# Patient Record
Sex: Male | Born: 1975 | Race: White | Hispanic: No | Marital: Married | Smoking: Never smoker
Health system: Southern US, Community
[De-identification: ages and names within clinical notes are randomized; demographics above are authoritative.]

## PROBLEM LIST (undated history)

## (undated) HISTORY — PX: UPPER GASTROINTESTINAL ENDOSCOPY: SHX188

## (undated) HISTORY — PX: NO PAST SURGERIES: SHX2092

---

## 2001-07-29 ENCOUNTER — Emergency Department (HOSPITAL_COMMUNITY): Admission: EM | Admit: 2001-07-29 | Discharge: 2001-07-29 | Payer: Self-pay | Admitting: Emergency Medicine

## 2002-08-15 ENCOUNTER — Emergency Department (HOSPITAL_COMMUNITY): Admission: EM | Admit: 2002-08-15 | Discharge: 2002-08-15 | Payer: Self-pay

## 2006-09-27 ENCOUNTER — Emergency Department (HOSPITAL_COMMUNITY): Admission: EM | Admit: 2006-09-27 | Discharge: 2006-09-27 | Payer: Self-pay | Admitting: Emergency Medicine

## 2007-07-20 ENCOUNTER — Ambulatory Visit: Payer: Self-pay | Admitting: Family Medicine

## 2007-07-20 DIAGNOSIS — R110 Nausea: Secondary | ICD-10-CM

## 2007-07-20 DIAGNOSIS — J029 Acute pharyngitis, unspecified: Secondary | ICD-10-CM

## 2007-07-20 LAB — CONVERTED CEMR LAB
Glucose, Urine, Semiquant: NEGATIVE
Protein, U semiquant: NEGATIVE
Rapid Strep: NEGATIVE
WBC Urine, dipstick: NEGATIVE
pH: 7

## 2007-09-04 ENCOUNTER — Encounter: Payer: Self-pay | Admitting: Family Medicine

## 2007-11-08 ENCOUNTER — Ambulatory Visit: Payer: Self-pay | Admitting: Family Medicine

## 2007-11-08 DIAGNOSIS — J069 Acute upper respiratory infection, unspecified: Secondary | ICD-10-CM | POA: Insufficient documentation

## 2007-11-12 ENCOUNTER — Telehealth: Payer: Self-pay | Admitting: Family Medicine

## 2009-10-23 ENCOUNTER — Ambulatory Visit: Payer: Self-pay | Admitting: Family Medicine

## 2009-10-23 DIAGNOSIS — J329 Chronic sinusitis, unspecified: Secondary | ICD-10-CM | POA: Insufficient documentation

## 2009-12-07 ENCOUNTER — Ambulatory Visit: Payer: Self-pay | Admitting: Family Medicine

## 2009-12-07 DIAGNOSIS — R109 Unspecified abdominal pain: Secondary | ICD-10-CM | POA: Insufficient documentation

## 2009-12-08 ENCOUNTER — Encounter: Admission: RE | Admit: 2009-12-08 | Discharge: 2009-12-08 | Payer: Self-pay | Admitting: Family Medicine

## 2010-01-07 ENCOUNTER — Ambulatory Visit: Payer: Self-pay | Admitting: Family Medicine

## 2010-02-16 NOTE — Assessment & Plan Note (Signed)
Summary: Sinusitis   Vital Signs:  Patient profile:   35 year old male Height:      72.5 inches Weight:      178 pounds Pulse rate:   73 / minute BP sitting:   119 / 68  (right arm) Cuff size:   regular  Vitals Entered By: Avon Gully CMA, Duncan Dull) (October 23, 2009 4:18 PM) CC: itchy ears and sore throat since sat, worse at night   Primary Care Provider:  Nani Gasser MD  CC:  itchy ears and sore throat since sat and worse at night.  History of Present Illness: itchy ears and sore throat since sat, worse at night. Sxs for 7 days.  Ears are better. Now have ST.  Worse withcough or sneeze.  Sinus pressure around the right eye. No fever or HA. No GI sxs. Started mucinex D and nasal saline wash andstill not better.   Current Medications (verified): 1)  None  Allergies (verified): 1)  ! Pcn  Comments:  Nurse/Medical Assistant: The patient's medications and allergies were reviewed with the patient and were updated in the Medication and Allergy Lists. Avon Gully CMA, Duncan Dull) (October 23, 2009 4:19 PM)  Physical Exam  General:  Well-developed,well-nourished,in no acute distress; alert,appropriate and cooperative throughout examination Head:  Normocephalic and atraumatic without obvious abnormalities. No apparent alopecia or balding. Eyes:  No corneal or conjunctival inflammation noted. EOMI. Perrla. Ears:  External ear exam shows no significant lesions or deformities.  Otoscopic examination reveals clear canals, tympanic membranes are intact bilaterally without bulging, retraction, inflammation or discharge. Hearing is grossly normal bilaterally. Nose:  External nasal examination shows no deformity or inflammation.  Mouth:  Oral mucosa and oropharynx without lesions or exudates.  Teeth in good repair. Neck:  No deformities, masses, or tenderness noted. Lungs:  Normal respiratory effort, chest expands symmetrically. Lungs are clear to auscultation, no crackles or  wheezes. Heart:  Normal rate and regular rhythm. S1 and S2 normal without gallop, murmur, click, rub or other extra sounds. Skin:  no rashes.   Cervical Nodes:  No lymphadenopathy noted Psych:  Cognition and judgment appear intact. Alert and cooperative with normal attention span and concentration. No apparent delusions, illusions, hallucinations   Impression & Recommendations:  Problem # 1:  SINUSITIS (ICD-473.9) Sx for one week with one sided sinus pressure, likely bacterail sinusitis. Also recommend add an antihistamine since sxs started with ear itching which is more consistant with allergies.  The following medications were removed from the medication list:    Zithromax Z-pak 250 Mg Tabs (Azithromycin) .Marland Kitchen... Take as directed His updated medication list for this problem includes:    Zithromax Z-pak 250 Mg Tabs (Azithromycin) .Marland Kitchen... Take as directed.  Take antibiotics for full duration. Discussed treatment options including indications for coronal CT scan of sinuses and ENT referral.   Complete Medication List: 1)  Zithromax Z-pak 250 Mg Tabs (Azithromycin) .... Take as directed.  Patient Instructions: 1)  Call if not better in one week.  2)  Get your flu shot at work.   Contraindications/Deferment of Procedures/Staging:    Test/Procedure: FLU VAX    Reason for deferment: patient declined  Prescriptions: ZITHROMAX Z-PAK 250 MG TABS (AZITHROMYCIN) Take as directed.  #1 pack x 0   Entered and Authorized by:   Nani Gasser MD   Signed by:   Nani Gasser MD on 10/23/2009   Method used:   [facility] to        [facility]  [address] (retail)       [address] [place], [place]  [address]       Ph: [phone] or [phone]       Fax: [phone]   RxID:   [id number]

## 2010-02-16 NOTE — Assessment & Plan Note (Signed)
Summary: Groin Pain   Vital Signs:  Patient profile:   35 year old male Height:      72.5 inches Weight:      176 pounds Pulse rate:   75 / minute BP sitting:   137 / 92  (right arm) Cuff size:   regular  Vitals Entered By: Avon Gully CMA, Duncan Dull) (December 07, 2009 4:28 PM) CC: severe pain in the groin x 12 days   Primary Care Provider:  Nani Gasser MD  CC:  severe pain in the groin x 12 days.  History of Present Illness: severe pain in the groin x 12 days. NOthing popping, pulled or cuased any traum. About 2 days later felt worse. got better for a couple of days,and they got worse again. Today feels like kicked in the groin. In thr groin crease on the right. No bulging. No hx of hernia.  No fever.  he rates his pain 4 out of 10  Current Medications (verified): 1)  None  Allergies (verified): 1)  ! Pcn  Comments:  Nurse/Medical Assistant: The patient's medications and allergies were reviewed with the patient and were updated in the Medication and Allergy Lists. Avon Gully CMA, Duncan Dull) (December 07, 2009 4:28 PM)  Physical Exam  General:  Well-developed,well-nourished,in no acute distress; alert,appropriate and cooperative throughout examination Genitalia:  No palpable hernia. Tender above the testile along the spermatic cord. No swelling or redness.     Impression & Recommendations:  Problem # 1:  GROIN PAIN (ICD-789.09) I am concerned about testicular torsion. I don't feel any herniations. Will refer for [place]. If pain suddenly gets worse, fever or nausea then needs to go to the ED.  we were able to an ultrasound scheduled first thing tomorrow morning.  If this is normal I will refer him to urology for further evaluation.  he can use Tylenol or ibuprofen p.r.n. for acute pain.  Right now he is not taking anything. Orders: T-*Unlisted Diagnostic X-ray test/procedure [id number])   Orders Added: 1)  T-*Unlisted Diagnostic X-ray test/procedure [09811] 2)   Est. Patient Level IV [91478]

## 2010-04-29 ENCOUNTER — Ambulatory Visit: Payer: Self-pay | Admitting: Family Medicine

## 2010-09-08 ENCOUNTER — Emergency Department (HOSPITAL_COMMUNITY): Payer: Worker's Compensation

## 2010-09-08 ENCOUNTER — Telehealth: Payer: Self-pay | Admitting: Family Medicine

## 2010-09-08 ENCOUNTER — Emergency Department (HOSPITAL_COMMUNITY)
Admission: EM | Admit: 2010-09-08 | Discharge: 2010-09-08 | Disposition: A | Discharge status: Home / Self Care | Payer: Worker's Compensation | Attending: Emergency Medicine | Admitting: Emergency Medicine

## 2010-09-08 DIAGNOSIS — Y990 Civilian activity done for income or pay: Secondary | ICD-10-CM | POA: Insufficient documentation

## 2010-09-08 DIAGNOSIS — X500XXA Overexertion from strenuous movement or load, initial encounter: Secondary | ICD-10-CM | POA: Insufficient documentation

## 2010-09-08 DIAGNOSIS — Y9269 Other specified industrial and construction area as the place of occurrence of the external cause: Secondary | ICD-10-CM | POA: Insufficient documentation

## 2010-09-08 DIAGNOSIS — S93409A Sprain of unspecified ligament of unspecified ankle, initial encounter: Secondary | ICD-10-CM | POA: Insufficient documentation

## 2010-09-08 NOTE — Telephone Encounter (Signed)
Error. The xray was done in theED. Not sure why send to my desktop.

## 2011-04-01 ENCOUNTER — Encounter: Payer: Self-pay | Admitting: Physician Assistant

## 2011-04-01 ENCOUNTER — Ambulatory Visit (INDEPENDENT_AMBULATORY_CARE_PROVIDER_SITE_OTHER): Payer: 59 | Admitting: Physician Assistant

## 2011-04-01 ENCOUNTER — Other Ambulatory Visit: Payer: Self-pay | Admitting: *Deleted

## 2011-04-01 VITALS — BP 131/85 | HR 96 | Temp 98.4°F | Ht 72.0 in | Wt 174.0 lb

## 2011-04-01 DIAGNOSIS — Z131 Encounter for screening for diabetes mellitus: Secondary | ICD-10-CM

## 2011-04-01 DIAGNOSIS — J329 Chronic sinusitis, unspecified: Secondary | ICD-10-CM

## 2011-04-01 DIAGNOSIS — Z13220 Encounter for screening for lipoid disorders: Secondary | ICD-10-CM

## 2011-04-01 MED ORDER — AZITHROMYCIN 250 MG PO TABS: ORAL_TABLET | ORAL | Status: AC

## 2011-04-01 NOTE — Progress Notes (Signed)
  Subjective:    Patient ID: Micheal Brewer, male    DOB: 1975-02-22, 36 y.o.   MRN: [id number]  Sinusitis This is a new problem. The problem has been gradually improving since onset. There has been no fever. Pain scale: Pain from sinus pressure is 5/10. sore throat pain today 4/10. The pain is moderate. Associated symptoms include chills, congestion, coughing, headaches, sinus pressure, a sore throat and swollen glands. Pertinent negatives include no ear pain, hoarse voice, neck pain, shortness of breath or sneezing. Past treatments include acetaminophen, saline sprays and sitting up (Mucinex and sinus rinses.). The treatment provided moderate (Today he reports feeling much better everywhere else but he still has a sore throat.) relief.     Review of Systems  Constitutional: Positive for chills.  HENT: Positive for congestion, sore throat and sinus pressure. Negative for ear pain, hoarse voice, sneezing and neck pain.   Respiratory: Positive for cough. Negative for shortness of breath.   Neurological: Positive for headaches.       Objective:   Physical Exam  Constitutional: He is oriented to person, place, and time. He appears well-developed and well-nourished.  HENT:  Head: Normocephalic and atraumatic.  Right Ear: External ear normal.  Left Ear: External ear normal.       Bilateral turbinates red and swollen with nasal discharge present.  TM's normal. Oropharynx erythematous without exudate and post nasal drip present.  Eyes: Conjunctivae are normal.  Neck:       Anterior cervical lymph nodes enlarged and tender to palpation bilaterally.   Cardiovascular: Normal rate, regular rhythm and normal heart sounds.   Pulmonary/Chest: Effort normal and breath sounds normal. He has no wheezes.  Lymphadenopathy:    He has cervical adenopathy.  Neurological: He is alert and oriented to person, place, and time.  Psychiatric: He has a normal mood and affect. His behavior is normal.           Assessment & Plan:  Sinusitis- Zpak was given but I encourage to to wait it out a few days and see if you continue to improve if you do not then start Zpak for 5 days. In the meantime continue to use mucinex, tylenol, and sinus rinses to keep the mucus and drainage from settling. Treat sore throat with salt water gargles and lozenges.   Gave handout for labs to get blood drawn with in the next 2 weeks for cholesterol and CMP. Will call with results.

## 2011-04-01 NOTE — Patient Instructions (Signed)
Zpak was given but I encourage to to wait it out a few days and see if you continue to improve if you do not then start Zpak for 5 days. Gave handout for labs to get blood drawn with in the next 2 weeks for cholesterol. Fast for 8 hours before.  Sinusitis Sinuses are air pockets within the bones of your face. The growth of bacteria within a sinus leads to infection. The infection prevents the sinuses from draining. This infection is called sinusitis. SYMPTOMS  There will be different areas of pain depending on which sinuses have become infected.  The maxillary sinuses often produce pain beneath the eyes.   Frontal sinusitis may cause pain in the middle of the forehead and above the eyes.  Other problems (symptoms) include:  Toothaches.   Colored, pus-like (purulent) drainage from the nose.   Swelling, warmth, and tenderness over the sinus areas may be signs of infection.  TREATMENT  Sinusitis is most often determined by an exam.X-rays may be taken. If x-rays have been taken, make sure you obtain your results or find out how you are to obtain them. Your caregiver may give you medications (antibiotics). These are medications that will help kill the bacteria causing the infection. You may also be given a medication (decongestant) that helps to reduce sinus swelling.  HOME CARE INSTRUCTIONS   Only take over-the-counter or prescription medicines for pain, discomfort, or fever as directed by your caregiver.   Drink extra fluids. Fluids help thin the mucus so your sinuses can drain more easily.   Applying either moist heat or ice packs to the sinus areas may help relieve discomfort.   Use saline nasal sprays to help moisten your sinuses. The sprays can be found at your local drugstore.  SEEK IMMEDIATE MEDICAL CARE IF:  You have a fever.   You have increasing pain, severe headaches, or toothache.   You have nausea, vomiting, or drowsiness.   You develop unusual swelling around the face or  trouble seeing.  MAKE SURE YOU:   Understand these instructions.   Will watch your condition.   Will get help right away if you are not doing well or get worse.  Document Released: 01/03/2005 Document Revised: 12/23/2010 Document Reviewed: 08/02/2006 [facility] Patient Information 2012 [place], [place].

## 2011-04-01 NOTE — Telephone Encounter (Signed)
Pt would like the Zpack [facility] [address]. Pt does not have the script in his possession.

## 2011-04-04 NOTE — Telephone Encounter (Signed)
Medication sent when encounter was for visit.

## 2011-09-26 ENCOUNTER — Encounter: Payer: Self-pay | Admitting: Family Medicine

## 2011-10-03 ENCOUNTER — Encounter: Payer: Self-pay | Admitting: Family Medicine

## 2011-10-03 ENCOUNTER — Ambulatory Visit (INDEPENDENT_AMBULATORY_CARE_PROVIDER_SITE_OTHER): Payer: 59 | Admitting: Family Medicine

## 2011-10-03 VITALS — BP 122/82 | HR 68 | Wt 176.0 lb

## 2011-10-03 DIAGNOSIS — Z23 Encounter for immunization: Secondary | ICD-10-CM

## 2011-10-03 DIAGNOSIS — Z0000 Encounter for general adult medical examination without abnormal findings: Secondary | ICD-10-CM

## 2011-10-03 LAB — COMPLETE METABOLIC PANEL WITH GFR
ALT: 11 U/L (ref 0–53)
CO2: 27 mEq/L (ref 19–32)
Creat: 1.09 mg/dL (ref 0.50–1.35)
GFR, Est African American: >89 mL/min
Total Bilirubin: 0.8 mg/dL (ref 0.3–1.2)

## 2011-10-03 LAB — CBC
HCT: 41.9 % (ref 39.0–52.0)
MCH: 32.7 pg (ref 26.0–34.0)
MCHC: 35.3 g/dL (ref 30.0–36.0)
MCV: 92.5 fL (ref 78.0–100.0)
RDW: 12.8 % (ref 11.5–15.5)

## 2011-10-03 LAB — LIPID PANEL
Cholesterol: 149 mg/dL (ref 0–200)
HDL: 37 mg/dL — ABNORMAL LOW (ref >39)
LDL Cholesterol: 92 mg/dL (ref 0–99)
Triglycerides: 99 mg/dL (ref <150)

## 2011-10-03 NOTE — Progress Notes (Signed)
  Subjective:    Patient ID: Micheal Brewer, male    DOB: [birth year], 36 y.o.   MRN: [id number]  HPI Here for CPE today. No specific complaints.    Review of Systems BP 122/82  Pulse 68  Wt 176 lb (79.833 kg)    Allergies  Allergen Reactions  . Penicillins     REACTION: rash    No past medical history on file.  Past Surgical History  Procedure Date  . No past surgeries     History   Social History  . Marital Status: Married    Spouse Name: Crystal    Number of Children: 1  . Years of Education: N/A   Occupational History  . Police Officer  [employer]   Social History Main Topics  . Smoking status: Never Smoker   . Smokeless tobacco: Not on file  . Alcohol Use: 3.0 - 4.0 oz/week    6-8 drink(s) per week  . Drug Use: No  . Sexually Active: Not on file   Other Topics Concern  . Not on file   Social History Narrative   Works out regularly.     Family History  Problem Relation Age of Onset  . Heart failure Mother   . Hypertension Brother     Outpatient Encounter Prescriptions as of 10/03/2011  Medication Sig Dispense Refill  . loratadine (CLARITIN) 10 MG tablet Take 10 mg by mouth as needed.               Objective:   Physical Exam  Constitutional: He is oriented to person, place, and time. He appears well-developed and well-nourished.  HENT:  Head: Normocephalic and atraumatic.  Right Ear: External ear normal.  Left Ear: External ear normal.  Nose: Nose normal.  Mouth/Throat: Oropharynx is clear and moist.  Eyes: Conjunctivae normal and EOM are normal. Pupils are equal, round, and reactive to light.  Neck: Normal range of motion. Neck supple. No thyromegaly present.  Cardiovascular: Normal rate, regular rhythm, normal heart sounds and intact distal pulses.   Pulmonary/Chest: Effort normal and breath sounds normal.  Abdominal: Soft. Bowel sounds are normal. He exhibits no distension and no mass. There is no tenderness. There is no  rebound and no guarding.  Musculoskeletal: Normal range of motion.  Lymphadenopathy:    He has no cervical adenopathy.  Neurological: He is alert and oriented to person, place, and time. He has normal reflexes.  Skin: Skin is warm and dry.       Small 1.5 cm lipoma on the mid chest over the xiphoid process. It is mobile,   Psychiatric: He has a normal mood and affect. His behavior is normal. Judgment and thought content normal.          Assessment & Plan:  Start a regular exercise program and make sure you are eating a healthy diet Try to eat 4 servings of dairy a day or take a calcium supplement (500mg  twice a day). Your vaccines are up to date.  Due for CMP, lipids, cbc.

## 2011-10-03 NOTE — Patient Instructions (Addendum)
Start a regular exercise program and make sure you are eating a healthy diet Try to eat 4 servings of dairy a day  Your vaccines are up to date.   

## 2011-10-04 ENCOUNTER — Encounter: Payer: Self-pay | Admitting: *Deleted

## 2013-03-08 ENCOUNTER — Encounter: Payer: Self-pay | Admitting: Emergency Medicine

## 2013-03-08 ENCOUNTER — Emergency Department (INDEPENDENT_AMBULATORY_CARE_PROVIDER_SITE_OTHER): Payer: 59

## 2013-03-08 ENCOUNTER — Emergency Department
Admission: EM | Admit: 2013-03-08 | Discharge: 2013-03-08 | Disposition: A | Discharge status: Home / Self Care | Payer: 59 | Source: Home / Self Care | Attending: Family Medicine | Admitting: Family Medicine

## 2013-03-08 DIAGNOSIS — M25539 Pain in unspecified wrist: Secondary | ICD-10-CM

## 2013-03-08 DIAGNOSIS — S63509A Unspecified sprain of unspecified wrist, initial encounter: Secondary | ICD-10-CM

## 2013-03-08 DIAGNOSIS — S62109A Fracture of unspecified carpal bone, unspecified wrist, initial encounter for closed fracture: Secondary | ICD-10-CM

## 2013-03-08 DIAGNOSIS — M25439 Effusion, unspecified wrist: Secondary | ICD-10-CM

## 2013-03-08 NOTE — ED Provider Notes (Addendum)
CSN: [id number]     Arrival date & time 03/08/13  1759 History   First MD Initiated Contact with Patient 03/08/13 1804     Chief Complaint  Patient presents with  . Wrist Pain      HPI Right wrist pain x1 day. Patient accidentally fell landing on his right wrist earlier today. Patient slipped on the ice. Has had ulnar sided wrist pain since this point. Pain borders on the deviation. Mild swelling.  No numbness or paresthesias History reviewed. No pertinent past medical history. Past Surgical History  Procedure Laterality Date  . No past surgeries     Family History  Problem Relation Age of Onset  . Heart failure Mother   . Hypertension Brother    History  Substance Use Topics  . Smoking status: Never Smoker   . Smokeless tobacco: Not on file  . Alcohol Use: 3 - 4 oz/week    6-8 drink(s) per week    Review of Systems  All other systems reviewed and are negative.      Allergies  Penicillins  Home Medications   Current Outpatient Rx  Name  Route  Sig  Dispense  Refill  . loratadine (CLARITIN) 10 MG tablet   Oral   Take 10 mg by mouth as needed.           BP 145/86  Pulse 84  Resp 14  Wt 182 lb (82.555 kg)  SpO2 98% Physical Exam  Constitutional: He appears well-developed and well-nourished.  HENT:  Head: Normocephalic and atraumatic.  Eyes: Conjunctivae are normal. Pupils are equal, round, and reactive to light.  Neck: Normal range of motion.  Cardiovascular: Normal rate and regular rhythm.   Pulmonary/Chest: Effort normal.  Abdominal: Soft.  Musculoskeletal:       Hands: Neurological: He is alert.    ED Course  Procedures (including critical care time) Labs Review Labs Reviewed - No data to display Imaging Review Dg Wrist Complete Right  03/08/2013   CLINICAL DATA:  Wrist pain, fall  EXAM: RIGHT WRIST - COMPLETE 3+ VIEW  COMPARISON:  None.  FINDINGS: Normal alignment. Distal radius and ulna appear intact. Preserved joint spaces. No  significant arthropathy.  On the lateral view, there is dorsal soft tissue swelling, with a subtle lucency through the dorsal aspect of one of the carpal bones suspicious for a nondisplaced carpal bone fracture. No corresponding abnormality on the other views.  IMPRESSION: Dorsal soft tissue swelling. Findings suspicious for a small nondisplaced carpal bone fracture dorsally, only on the lateral view.   Electronically Signed   By: Daryll Brod M.D.   On: 03/08/2013 19:39      MDM   Final diagnoses:  Wrist sprain  Closed fracture of carpal bone   Patient initially placed in right wrist brace prior to formal imaging read with sports medicine followup next week if symptoms not improved (preliminarily read as negative in the office before formal radiology interpretation) Noted questionable carpal bone fractures on formal imaging read today. No scaphoid involvement. Primarily over TF CC distribution Discuss case with sports medicine. Wrist brace appropriate with plan for sports medicine followup early next week. Rice and NSAIDs in the interim. Message left for nursing to contact patient about imaging read (unable to leave a message as voicemail not set up)    The patient and/or caregiver has been counseled thoroughly with regard to treatment plan and/or medications prescribed including dosage, schedule, interactions, rationale for use, and possible side effects and they  verbalize understanding. Diagnoses and expected course of recovery discussed and will return if not improved as expected or if the condition worsens. Patient and/or caregiver verbalized understanding.         Shanda Howells, MD 03/08/13 2025  Shanda Howells, MD 03/08/13 2032

## 2013-03-08 NOTE — ED Notes (Signed)
Micheal Brewer fell today on ice, bracing self with right wrist. No prev inj.

## 2013-03-11 ENCOUNTER — Ambulatory Visit (INDEPENDENT_AMBULATORY_CARE_PROVIDER_SITE_OTHER): Payer: 59 | Admitting: Sports Medicine

## 2013-03-11 ENCOUNTER — Encounter: Payer: Self-pay | Admitting: Sports Medicine

## 2013-03-11 ENCOUNTER — Telehealth: Payer: Self-pay | Admitting: *Deleted

## 2013-03-11 VITALS — BP 143/85 | HR 77 | Ht 72.0 in | Wt 180.0 lb

## 2013-03-11 DIAGNOSIS — S62143A Displaced fracture of body of hamate [unciform] bone, unspecified wrist, initial encounter for closed fracture: Secondary | ICD-10-CM

## 2013-03-11 DIAGNOSIS — S62144A Nondisplaced fracture of body of hamate [unciform] bone, right wrist, initial encounter for closed fracture: Secondary | ICD-10-CM | POA: Insufficient documentation

## 2013-03-11 NOTE — Progress Notes (Signed)
   Subjective:    I'm seeing this patient as a consultation for:  Dr. Ernestina Patches to get  CC: Right wrist pain  HPI: This is a very pleasant 38 year old male, a couple of days ago he slipped and fell onto an outstretched hand on the ice. He had immediate pain, swelling, and bruising. He was seen by the urgent care physician or x-ray showed what appeared to be a fracture, he was placed in a Velcro wrist brace and referred to me for further evaluation and definitive treatment. Pain is mild, localized over the dorsum of the wrist. He works as a Engineer, structural.  Past medical history, Surgical history, Family history not pertinant except as noted below, Social history, Allergies, and medications have been entered into the medical record, reviewed, and no changes needed.   Review of Systems: No headache, visual changes, nausea, vomiting, diarrhea, constipation, dizziness, abdominal pain, skin rash, fevers, chills, night sweats, weight loss, swollen lymph nodes, body aches, joint swelling, muscle aches, chest pain, shortness of breath, mood changes, visual or auditory hallucinations.   Objective:   General: Well Developed, well nourished, and in no acute distress.  Neuro/Psych: Alert and oriented x3, extra-ocular muscles intact, able to move all 4 extremities, sensation grossly intact. Skin: Warm and dry, no rashes noted.  Respiratory: Not using accessory muscles, speaking in full sentences, trachea midline.  Cardiovascular: Pulses palpable, no extremity edema. Abdomen: Does not appear distended. Right Wrist: Inspection normal with no visible erythema or swelling. ROM smooth and normal with good flexion and extension and ulnar/radial deviation that is symmetrical with opposite wrist. Tender to palpation over the dorsum of the hamate bone. No snuffbox tenderness. No tenderness over Canal of Guyon. Strength 5/5 in all directions without pain. Negative Finkelstein, tinel's and phalens. Negative  Watson's test.  X-rays were reviewed and they show a small bulging fracture from the right hamate.  Impression and Recommendations:   This case required medical decision making of moderate complexity.

## 2013-03-11 NOTE — Assessment & Plan Note (Signed)
This is a mild fracture and can be treated like a sprain. Left-handed work only. Return to see me in 3 weeks.  I billed a fracture code for this visit, all subsequent visits for this complaint will be "post-op checks" in the global period.

## 2013-04-02 ENCOUNTER — Encounter: Payer: Self-pay | Admitting: Sports Medicine

## 2013-04-02 ENCOUNTER — Ambulatory Visit (INDEPENDENT_AMBULATORY_CARE_PROVIDER_SITE_OTHER): Payer: 59 | Admitting: Sports Medicine

## 2013-04-02 VITALS — BP 121/71 | HR 76 | Ht 72.0 in | Wt 181.0 lb

## 2013-04-02 DIAGNOSIS — S62144A Nondisplaced fracture of body of hamate [unciform] bone, right wrist, initial encounter for closed fracture: Secondary | ICD-10-CM

## 2013-04-02 DIAGNOSIS — S62143A Displaced fracture of body of hamate [unciform] bone, unspecified wrist, initial encounter for closed fracture: Secondary | ICD-10-CM

## 2013-04-02 NOTE — Progress Notes (Signed)
  Subjective: 3 weeks status post avulsion fracture from the dorsal right-sided hamate bone pain-free.   Objective: General: Well-developed, well-nourished, and in no acute distress. Right Wrist: Inspection normal with no visible erythema or swelling. ROM smooth and normal with good flexion and extension and ulnar/radial deviation that is symmetrical with opposite wrist. Palpation is normal over metacarpals, navicular, lunate, and TFCC; tendons without tenderness/ swelling No snuffbox tenderness. No tenderness over Canal of [place]. Strength 5/5 in all directions without pain. Negative Finkelstein, tinel's and phalens. Negative Watson's test.  Assessment/plan:

## 2013-04-02 NOTE — Assessment & Plan Note (Signed)
3 weeks post avulsion fracture and completely healed clinically. Return to full duty.

## 2013-10-07 ENCOUNTER — Encounter: Payer: Self-pay | Admitting: Family Medicine

## 2013-10-07 ENCOUNTER — Ambulatory Visit (INDEPENDENT_AMBULATORY_CARE_PROVIDER_SITE_OTHER): Payer: 59 | Admitting: Family Medicine

## 2013-10-07 VITALS — BP 132/78 | HR 92 | Temp 98.2°F | Wt 188.0 lb

## 2013-10-07 DIAGNOSIS — R1900 Intra-abdominal and pelvic swelling, mass and lump, unspecified site: Secondary | ICD-10-CM

## 2013-10-07 DIAGNOSIS — Z23 Encounter for immunization: Secondary | ICD-10-CM

## 2013-10-07 NOTE — Progress Notes (Signed)
   Subjective:    Patient ID: Micheal Brewer, male    DOB: [birth year], 38 y.o.   MRN: [id number]  HPI Lump in abdomen- right side noticed this last month denies any injury/trauma, no pain, or SOB. Has smilar lesion in the epigastric area.  No change in size.  No fever chills or sweats or other constitutional symptoms.   Review of Systems     Objective:   Physical Exam  Constitutional: He is oriented to person, place, and time. He appears well-developed and well-nourished.  HENT:  Head: Normocephalic and atraumatic.  Abdominal: Soft. Bowel sounds are normal.  There is an approximately 3 x 1.5 cm soft palpable oval-shaped mass in the right upper quadrant. He has a very similar lesion in the epigastric area that is more round in approximately 2 x 2 cm. They're somewhat firm but not rock hard. There is no skin irritation or erythema. No swelling around the lesion. They are nontender.  Neurological: He is alert and oriented to person, place, and time.  Skin: Skin is warm and dry.  Psychiatric: He has a normal mood and affect. His behavior is normal.          Assessment & Plan:  Flu shot given today.    Mass - feels to be contained within the subcutaneous tissue. Does not appear to be attached to any bone or organ. most consistent with a lipoma. Gave reassurance. Offered to do an ultrasound for further assurance that he seems very nervous and worried about today. They can examine both lesions for confirmation. Does not feel quite like a lymph node. Will call with results once available.

## 2013-10-11 ENCOUNTER — Ambulatory Visit (INDEPENDENT_AMBULATORY_CARE_PROVIDER_SITE_OTHER): Payer: 59

## 2013-10-11 DIAGNOSIS — R1900 Intra-abdominal and pelvic swelling, mass and lump, unspecified site: Secondary | ICD-10-CM

## 2013-12-09 ENCOUNTER — Ambulatory Visit (INDEPENDENT_AMBULATORY_CARE_PROVIDER_SITE_OTHER): Payer: 59 | Admitting: Family Medicine

## 2013-12-09 ENCOUNTER — Encounter: Payer: Self-pay | Admitting: Family Medicine

## 2013-12-09 VITALS — BP 133/76 | HR 102 | Temp 98.0°F | Ht 73.0 in | Wt 185.0 lb

## 2013-12-09 DIAGNOSIS — R0982 Postnasal drip: Secondary | ICD-10-CM

## 2013-12-09 DIAGNOSIS — K921 Melena: Secondary | ICD-10-CM

## 2013-12-09 NOTE — Patient Instructions (Signed)
If you see blood again and please let me know and I'll refer you to gastroenterology for further evaluation.

## 2013-12-09 NOTE — Progress Notes (Addendum)
   Subjective:    Patient ID: Micheal Brewer, male    DOB: 1975-10-10, 38 y.o.   MRN: [id number]  HPI  1 episode on saturday of bright red blood in stool he had fruit punch gatorade that morning. denies and straining when having a BM.  No Rx medications.  No NSAIDs or ASA,   Has had dec appetite.  No one else sick at home.  Didn't eat out around this time. No abdominal cramping.  No fam hx of crohns disease etc.  Hasn't happened again did have a loose stool with the blood.  No BM since then.  Hx of hemorrhoids.     Sore throat and post nasal drip for a few week.  No fever, chills or sweats. No significant nasal congestion or cough. It's just causing his voice to come and go because of a sore throat. It's not painful to eat or swallow. He has been using Claritin with only minimal improvement.  Review of Systems     Objective:   Physical Exam  Constitutional: He is oriented to person, place, and time. He appears well-developed and well-nourished.  HENT:  Head: Normocephalic and atraumatic.  Right Ear: External ear normal.  Left Ear: External ear normal.  Nose: Nose normal.  Mouth/Throat: Oropharynx is clear and moist.  TMs and canals are clear.   Eyes: Conjunctivae and EOM are normal. Pupils are equal, round, and reactive to light.  Neck: Neck supple. No thyromegaly present.  Cardiovascular: Normal rate and normal heart sounds.   Pulmonary/Chest: Effort normal and breath sounds normal.  Genitourinary: Rectum normal. Guaiac negative stool.  No masses. Did you see a slightly irritated area on the right side of the rectum using the endoscope. I did not see any distinct internal or external hemorrhoids. No active bleeding. No ulcerations.  Lymphadenopathy:    He has no cervical adenopathy.  Neurological: He is alert and oriented to person, place, and time.  Skin: Skin is warm and dry.  Psychiatric: He has a normal mood and affect. His behavior is normal.     Guiaic is negative  today.     Assessment & Plan:  Blood in stool- gave reassurance. Exam was normal today. He calls negative. Suspect this was just a single episode. No red flags today. If it starts to recur than please let me know.  refer to [facility] for further evaluation. In the meantime work on a good bowel regimen with plenty of fiber and water to help move the stools for normally.  Postnasal drip causing sore throat-recommend running a humidifier at night. Continue with Claritin as needed. Picture taking plenty of fluids. He's not improving in the next couple weeks and please let me know.

## 2014-04-14 ENCOUNTER — Encounter: Payer: Self-pay | Admitting: Family Medicine

## 2014-04-14 ENCOUNTER — Ambulatory Visit (INDEPENDENT_AMBULATORY_CARE_PROVIDER_SITE_OTHER): Payer: 59 | Admitting: Family Medicine

## 2014-04-14 VITALS — BP 140/90 | HR 92 | Wt 188.0 lb

## 2014-04-14 DIAGNOSIS — D171 Benign lipomatous neoplasm of skin and subcutaneous tissue of trunk: Secondary | ICD-10-CM | POA: Diagnosis not present

## 2014-04-14 DIAGNOSIS — R21 Rash and other nonspecific skin eruption: Secondary | ICD-10-CM

## 2014-04-14 NOTE — Progress Notes (Signed)
   Subjective:    Patient ID: Micheal Brewer, male    DOB: Feb 15, 1975, 39 y.o.   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 Has new lesion on mid upper chest now. No painful.   Rash on chest for several weeks.  Tried hydrocortisone rx strength and didn't help. His son uses it for his eczema. No pain or itching.    Review of Systems     Objective:   Physical Exam  Abdominal:      Lesions above are palpable nodules in the subcutaneous tissue. They're not attached to bone or muscle but are not in the epidermis either. They are firm and nontender. He also has a smaller 1 x 1 cm lesion on the upper chest close to midline.  In the left upper quadrant abdomen he has a erythematous scaly circular lesion that's approximately 10 x 14 cm with central clearing. He has a very similar lesion on the little bit smaller closer to about 8 cm on the left upper chest. Again with central clearing.    Assessment & Plan:  Palpable nodules-most consistent with lipoma, though the one on the right upper abdomen is a little more firm than I would expect from a lipoma. He still is very concerned. We discussed CT/MRI versus actual surgical excision. As it only way to 100% know would be for him to have surgical excision and he is okay with that. He does want to think over it and call me back.  Ring worm - suspect ringworm based on diagnosis. I did do a skin screening for KOH.

## 2014-04-14 NOTE — Addendum Note (Signed)
Addended by: Eliezer Lofts on: 04/14/2014 02:39 PM   Modules accepted: Orders

## 2014-04-15 LAB — KOH PREP: RESULT - KOH: NONE SEEN

## 2014-04-23 ENCOUNTER — Telehealth: Payer: Self-pay | Admitting: *Deleted

## 2014-04-23 DIAGNOSIS — D171 Benign lipomatous neoplasm of skin and subcutaneous tissue of trunk: Secondary | ICD-10-CM

## 2014-04-23 NOTE — Telephone Encounter (Signed)
Referral for general surgery placed.

## 2014-05-09 ENCOUNTER — Telehealth: Payer: Self-pay | Admitting: *Deleted

## 2014-05-09 DIAGNOSIS — R21 Rash and other nonspecific skin eruption: Secondary | ICD-10-CM

## 2014-05-09 NOTE — Telephone Encounter (Signed)
Pt called and stated that he has not heard anything about the referral for [facility] he wanted to know what was going on with this. I gave him their # [phone] and I told him that I would also speak with the referral schedulers about this. (spoke w/[name] she will call). Pt also stated that he is still having issues with ring worm he said that he had been using OTC lamisil and this has not helped. I told him that I would send this to Dr. Madilyn Fireman for f/u.[name]

## 2014-05-09 NOTE — Telephone Encounter (Signed)
Thus he feels like it's helping at all. Does he feel like the lesions are getting a little smaller or fading away or is there no result at all?

## 2014-05-09 NOTE — Telephone Encounter (Signed)
Called pt back lvm asking for rtn about lesions.Micheal Brewer [place]

## 2014-05-12 NOTE — Telephone Encounter (Signed)
Pt informed .[name] Pt also stated that he has not heard anything from [facility]. Will forward to P. Murray in referral to check into this and get back with him.Micheal Brewer [place]

## 2014-05-12 NOTE — Telephone Encounter (Signed)
Pt called back and stated that the areas are not as red,scaley, the haven't shrunk or spread.Micheal Brewer [place]

## 2014-05-12 NOTE — Telephone Encounter (Signed)
OK, we can refer to dermatology.

## 2014-05-14 NOTE — Telephone Encounter (Signed)
[facility] states they had not received referral though fax number was verified. This is the second time I've had this problem.  Referral has been refaxed and I confirmed with Edmonia Lynch in Medical records on 4/26 that fax has been received. I updated Micheal Brewer.

## 2014-06-18 ENCOUNTER — Telehealth: Payer: Self-pay | Admitting: *Deleted

## 2014-06-18 NOTE — Telephone Encounter (Signed)
Pt called and states that he has not heard anything about his dermatology ref. This was placed on 4.25.16. I informed pt that I would check on this for him and get back with him. I called and spoke with Linzie Collin at [facility]([phone])and informed her that pt has not heard anything regarding this referral. She said that there were no record of receiving this and asked that I refax the referral. I confirmed the fax # [phone] she assured me that this was correct. I sent the referral, ov notes, ins. Card.   Called back to make sure that fax was received this was confirmed by Andee Poles.Elouise Munroe Confirmation fax received.Kadejah Sandiford, [place] pt and informed him that his information had to be resent and that he should be contacted soon.Audelia Hives [place]

## 2016-02-19 IMAGING — CR DG WRIST COMPLETE 3+V*R*
2 series · 2 of 2 positions shown · non-contrast
Comparison: None.

CLINICAL DATA: Wrist pain, fall

EXAM:
RIGHT WRIST - COMPLETE 3+ VIEW

[view not recorded (1 of 2)]
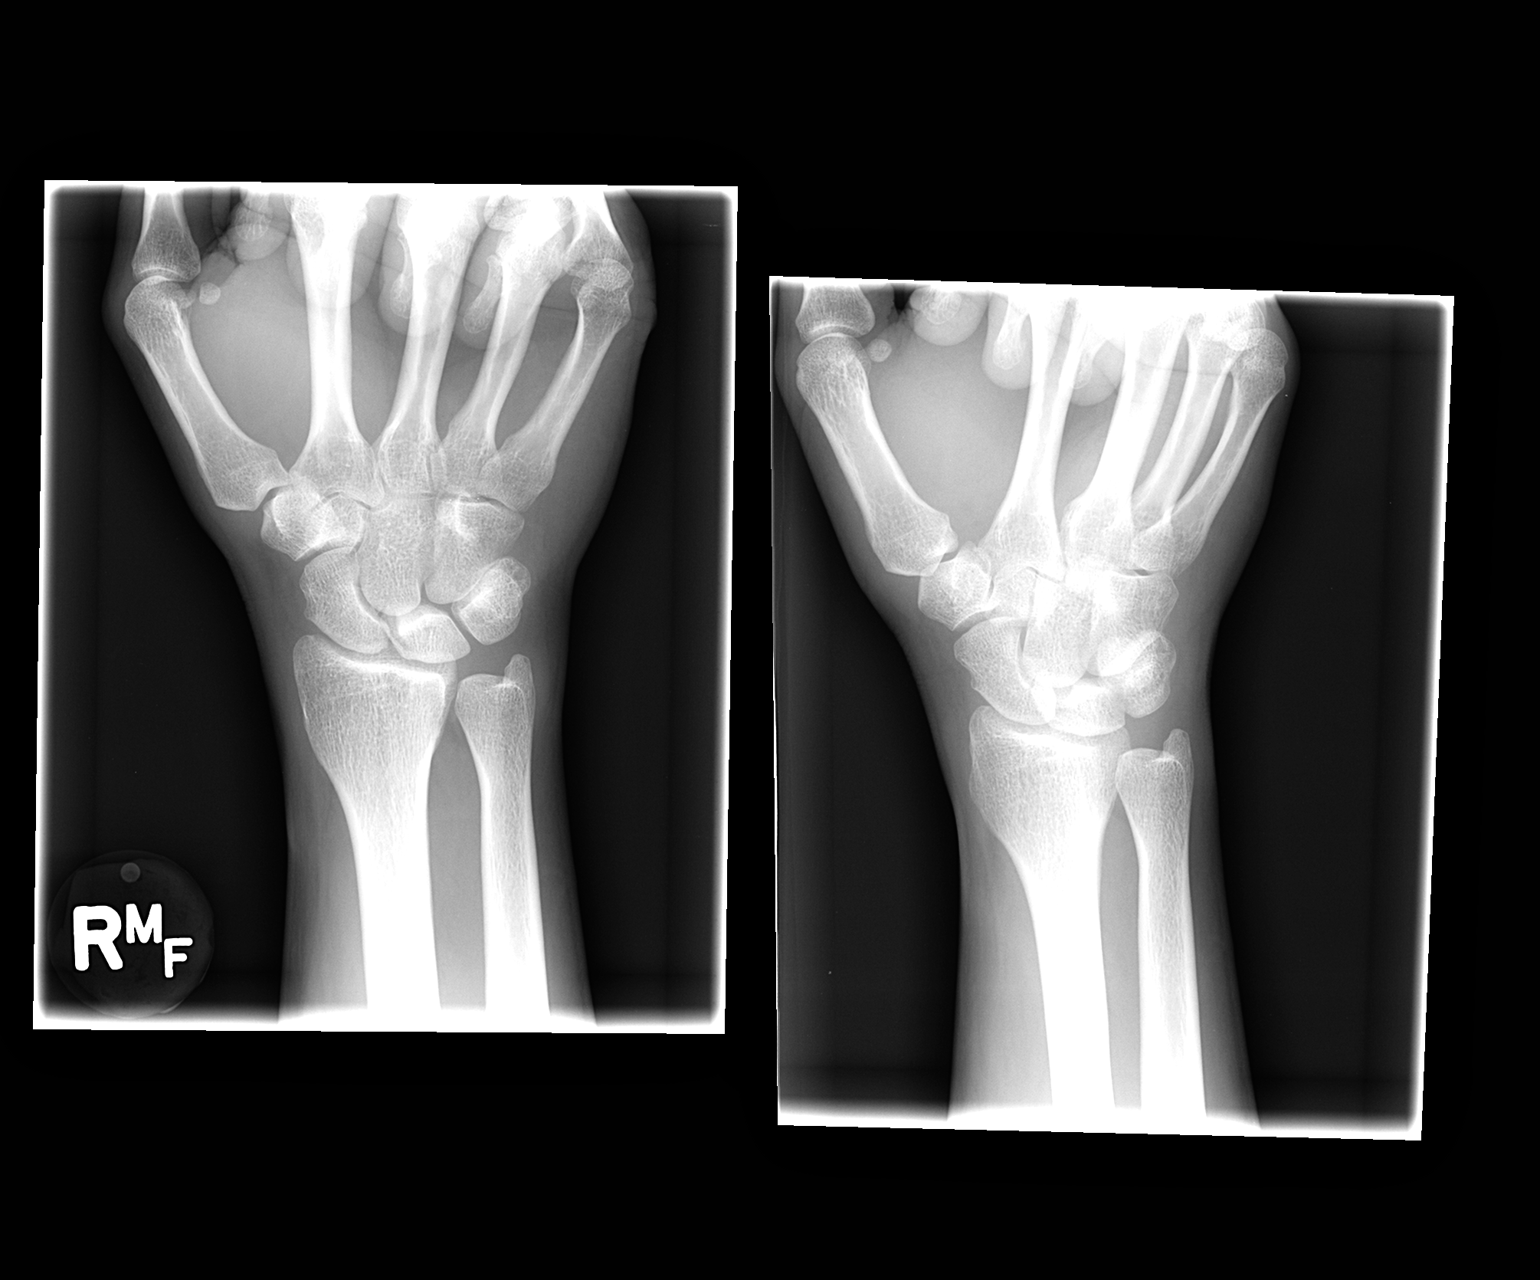

[view not recorded (2 of 2)]
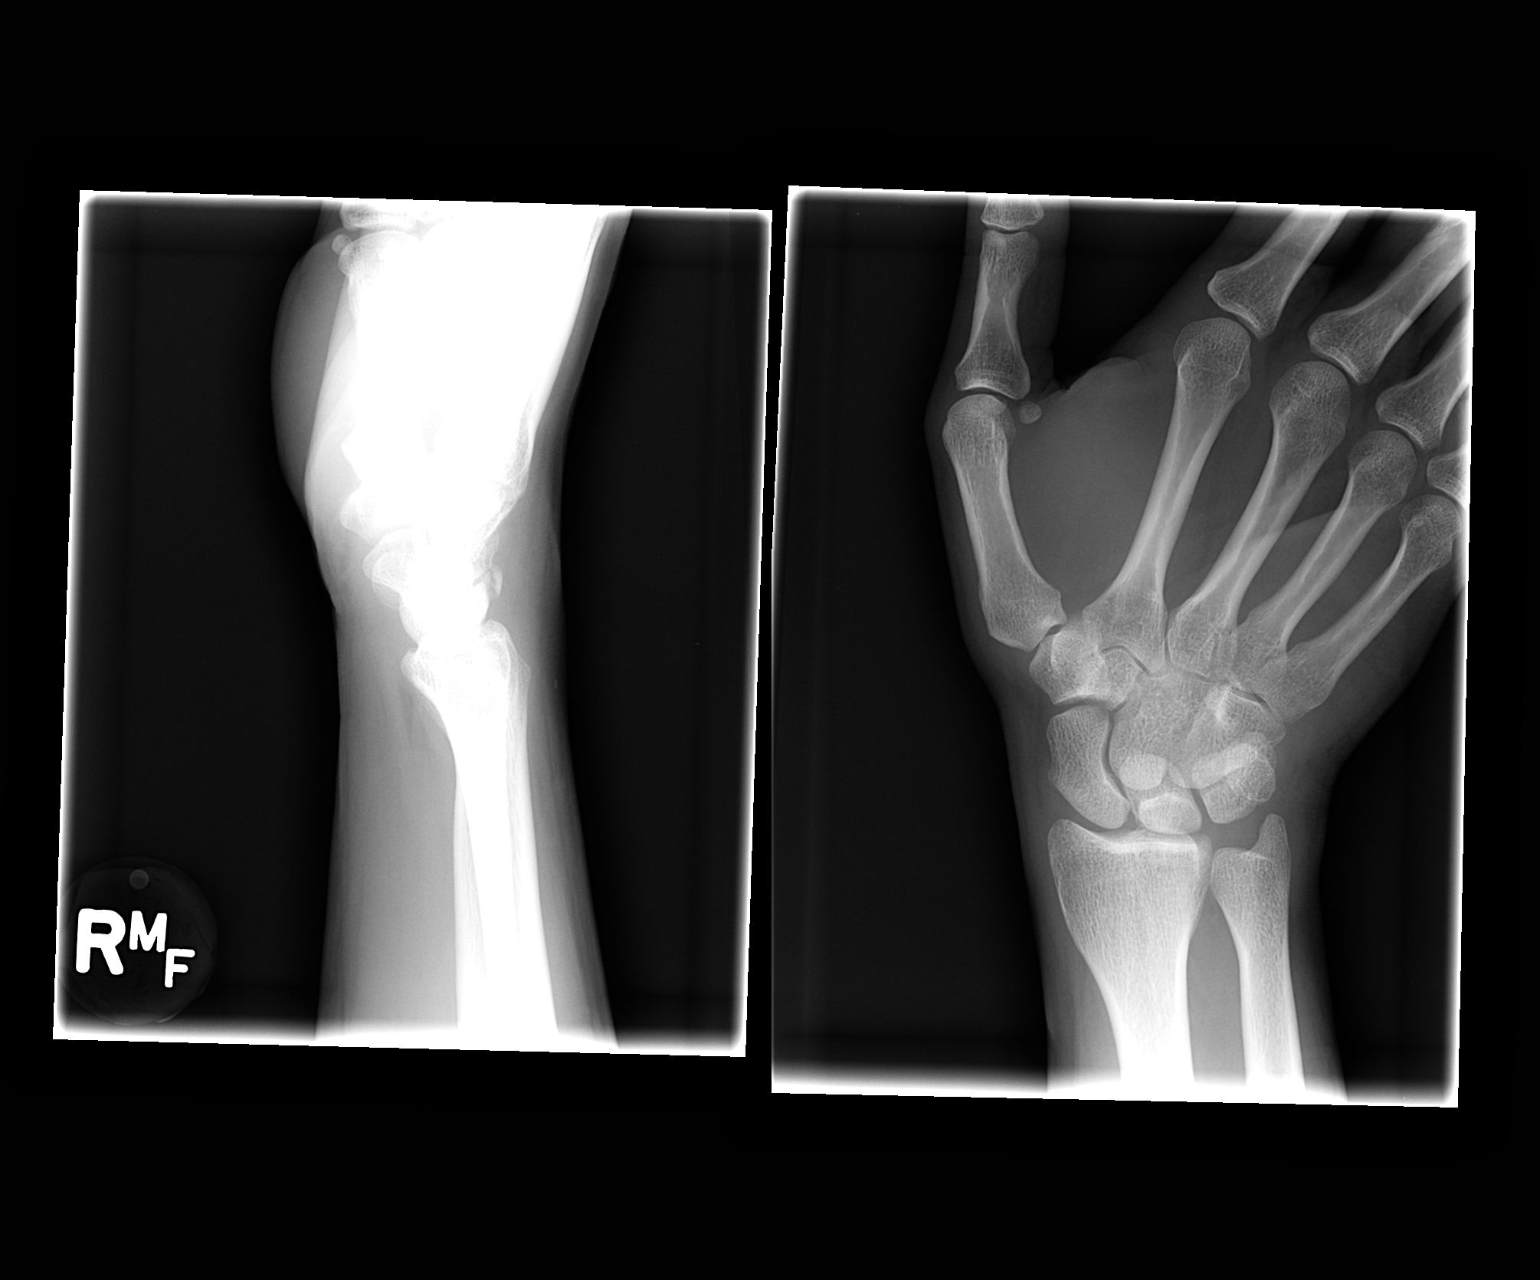

[2 of 2 positions shown; findings below may reference images not displayed]

FINDINGS: Normal alignment. Distal radius and ulna appear intact. Preserved
joint spaces. No significant arthropathy.

On the lateral view, there is dorsal soft tissue swelling, with a
subtle lucency through the dorsal aspect of one of the carpal bones
suspicious for a nondisplaced carpal bone fracture. No corresponding
abnormality on the other views.
IMPRESSION: Dorsal soft tissue swelling. Findings suspicious for a small
nondisplaced carpal bone fracture dorsally, only on the lateral
view.

## 2016-07-22 ENCOUNTER — Encounter: Payer: Self-pay | Admitting: *Deleted

## 2016-07-22 ENCOUNTER — Emergency Department
Admission: EM | Admit: 2016-07-22 | Discharge: 2016-07-22 | Disposition: A | Discharge status: Home / Self Care | Payer: 59 | Source: Home / Self Care | Attending: Family Medicine | Admitting: Family Medicine

## 2016-07-22 DIAGNOSIS — J069 Acute upper respiratory infection, unspecified: Secondary | ICD-10-CM | POA: Diagnosis not present

## 2016-07-22 DIAGNOSIS — B9789 Other viral agents as the cause of diseases classified elsewhere: Secondary | ICD-10-CM | POA: Diagnosis not present

## 2016-07-22 DIAGNOSIS — L03213 Periorbital cellulitis: Secondary | ICD-10-CM

## 2016-07-22 MED ORDER — CLINDAMYCIN HCL 300 MG PO CAPS: ORAL_CAPSULE | ORAL | 0 refills | Status: DC

## 2016-07-22 MED ORDER — SULFACETAMIDE SODIUM 10 % OP SOLN: 1.0000 [drp] | OPHTHALMIC | 0 refills | Status: DC

## 2016-07-22 NOTE — ED Triage Notes (Signed)
Patient c/o stye to right eye x 2 days. This is the 4th time this year he has had this year. Also c/o cough with deep breathing.

## 2016-07-22 NOTE — ED Provider Notes (Signed)
[facility]    CSN: [id number] Arrival date & time: 07/22/16  0836     History   Chief Complaint Chief Complaint  Patient presents with  . Eye Problem  . Cough    HPI Micheal Brewer is a 41 y.o. male.   Patient presents with two complaints: 1)  Five day history of typical cold-like symptoms developing over several days, including mild sore throat, sinus congestion, headache, fatigue, sweats, and cough.  2)  For two days he has had mild swelling in his right eye, and believes that he has a stye   The history is provided by the patient.    History reviewed. No pertinent past medical history.  Patient Active Problem List   Diagnosis Date Noted  . Closed nondisplaced fracture of body of right hamate bone 03/11/2013    Past Surgical History:  Procedure Laterality Date  . NO PAST SURGERIES         Home Medications    Prior to Admission medications   Medication Sig Start Date End Date Taking? Authorizing Provider  clindamycin (CLEOCIN) 300 MG capsule Take one cap by mouth every 8 hours 07/22/16   Kandra Nicolas, MD  sulfacetamide (BLEPH-10) 10 % ophthalmic solution Place 1-2 drops into the right eye every 3 (three) hours while awake. 07/22/16   Kandra Nicolas, MD    Family History Family History  Problem Relation Age of Onset  . Heart failure Mother   . Hypertension Brother     Social History Social History  Substance Use Topics  . Smoking status: Never Smoker  . Smokeless tobacco: Never Used  . Alcohol use 3.0 - 4.0 oz/week    6 - 8 Standard drinks or equivalent per week     Allergies   Penicillins   Review of Systems Review of Systems  + sore throat + cough No pleuritic pain No wheezing + nasal congestion + post-nasal drainage + sinus pain/pressure + swelling right eyelid No earache No hemoptysis No SOB No fever, + chills/sweats No nausea No vomiting No abdominal pain No diarrhea No urinary symptoms No skin rash +  fatigue + myalgias + headache Used OTC meds without relief    Physical Exam Triage Vital Signs ED Triage Vitals [07/22/16 0935]  Enc Vitals Group     BP (!) 145/93     Pulse Rate 69     Resp      Temp 98.6 F (37 C)     Temp Source Oral     SpO2 99 %     Weight 179 lb (81.2 kg)     Height      Head Circumference      Peak Flow      Pain Score 1     Pain Loc      Pain Edu?      Excl. in [place]?    No data found.   Updated Vital Signs BP 140/90 (BP Location: Right Arm)   Pulse 69   Temp 98.6 F (37 C) (Oral)   Wt 179 lb (81.2 kg)   SpO2 99%   BMI 23.62 kg/m   Visual Acuity Right Eye Distance: 20/20 Left Eye Distance: 20/13 Bilateral Distance: 20/13 (Uncorrected)  Right Eye Near:   Left Eye Near:    Bilateral Near:     Physical Exam  Eyes: Pupils are equal, round, and reactive to light. Conjunctivae and EOM are normal. Right eye exhibits hordeolum.    Right upper  eyelid has hordeolum as noted.  There is mild swelling, erythema and tenderness of the upper right eyelid.  Vitals reviewed. Nursing notes and Vital Signs reviewed. Appearance:  Patient appears stated age, and in no acute distress Eyes:  See eye exam diagram. Ears:  Canals normal.  Tympanic membranes normal.  Nose:  Mildly congested turbinates.  No sinus tenderness.   Pharynx:  Normal Neck:  Supple.  Enlarged posterior/lateral nodes are palpated bilaterally, tender to palpation on the left.   Lungs:  Clear to auscultation.  Breath sounds are equal.  Moving air well. Heart:  Regular rate and rhythm without murmurs, rubs, or gallops.  Abdomen:  Nontender without masses or hepatosplenomegaly.  Bowel sounds are present.  No CVA or flank tenderness.  Extremities:  No edema.  Skin:  No rash present.     UC Treatments / Results  Labs (all labs ordered are listed, but only abnormal results are displayed) Labs Reviewed - No data to display  EKG  EKG Interpretation None       Radiology No  results found.  Procedures Procedures (including critical care time)  Medications Ordered in UC Medications - No data to display   Initial Impression / Assessment and Plan / UC Course  I have reviewed the triage vital signs and the nursing notes.  Pertinent labs & imaging results that were available during my care of the patient were reviewed by me and considered in my medical decision making (see chart for details).    Begin clindamycin 300mg  Q8hr, and sulfacetamide ophthalmic suspension. Begin applying warm compress to right eye several times daily. Followup with ophthalmologist if not improved 3 days.  For cold symptoms: Take plain guaifenesin (1200mg  extended release tabs such as Mucinex) twice daily, with plenty of water, for cough and congestion.  May add Pseudoephedrine (30mg , one or two every 4 to 6 hours) for sinus congestion.  Get adequate rest.   Try warm salt water gargles for sore throat.  Stop all antihistamines for now, and other non-prescription cough/cold preparations. May take Delsym Cough Suppressant at bedtime for nighttime cough.  Follow-up with family doctor if not improving about10 days.     Final Clinical Impressions(s) / UC Diagnoses   Final diagnoses:  Viral URI with cough  Preseptal cellulitis of right upper eyelid    New Prescriptions New Prescriptions   CLINDAMYCIN (CLEOCIN) 300 MG CAPSULE    Take one cap by mouth every 8 hours   SULFACETAMIDE (BLEPH-10) 10 % OPHTHALMIC SOLUTION    Place 1-2 drops into the right eye every 3 (three) hours while awake.     Kandra Nicolas, MD 07/28/16 [phone]

## 2016-07-22 NOTE — Discharge Instructions (Signed)
Begin applying warm compress to right eye several times daily.  For cold symptoms: Take plain guaifenesin (1200mg  extended release tabs such as Mucinex) twice daily, with plenty of water, for cough and congestion.  May add Pseudoephedrine (30mg , one or two every 4 to 6 hours) for sinus congestion.  Get adequate rest.   Try warm salt water gargles for sore throat.  Stop all antihistamines for now, and other non-prescription cough/cold preparations. May take Delsym Cough Suppressant at bedtime for nighttime cough.  Follow-up with family doctor if not improving about10 days.

## 2016-09-23 IMAGING — US US MISC SOFT TISSUE
1 series · 14 of 14 positions shown · non-contrast
Comparison: None.

CLINICAL DATA: Abdominal masses.

EXAM:
SOFT TISSUE ULTRASOUND - MISCELLANEOUS
TECHNIQUE: Limited sonographic evaluation was performed of 2 palpable anterior
abdominal wall masses.

[Series 1: us misc soft tissue · 0.07mm/px · 14 of 14 slices shown]
[im 1/14]
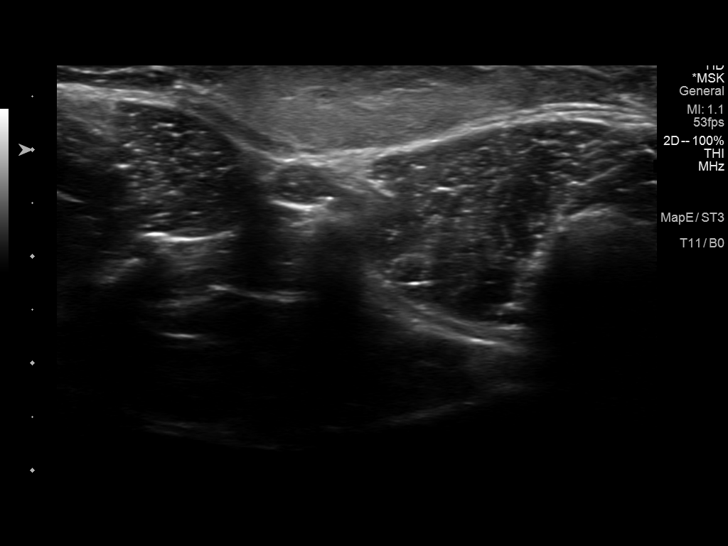
[im 2/14]
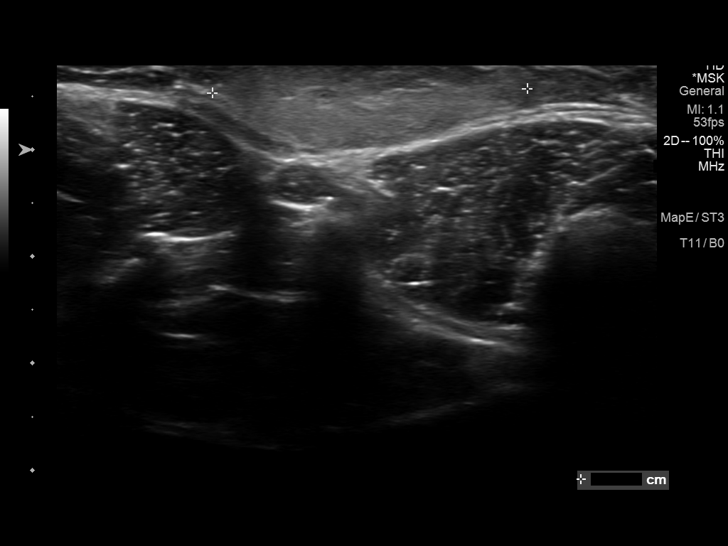
[im 3/14]
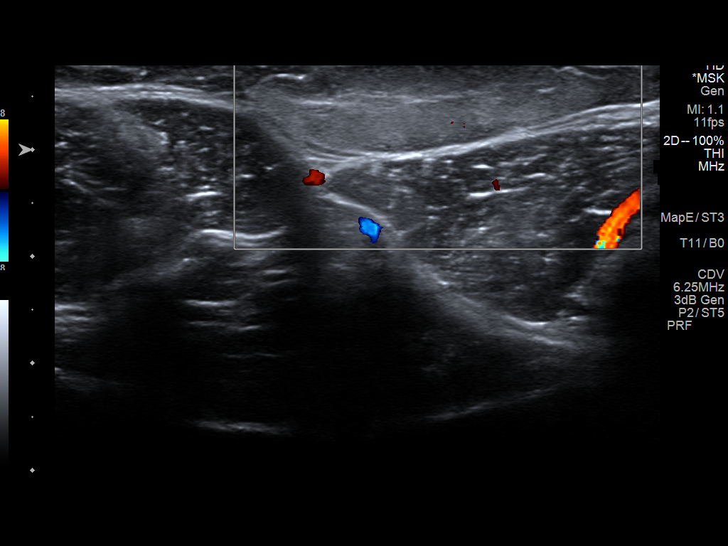
[im 4/14]
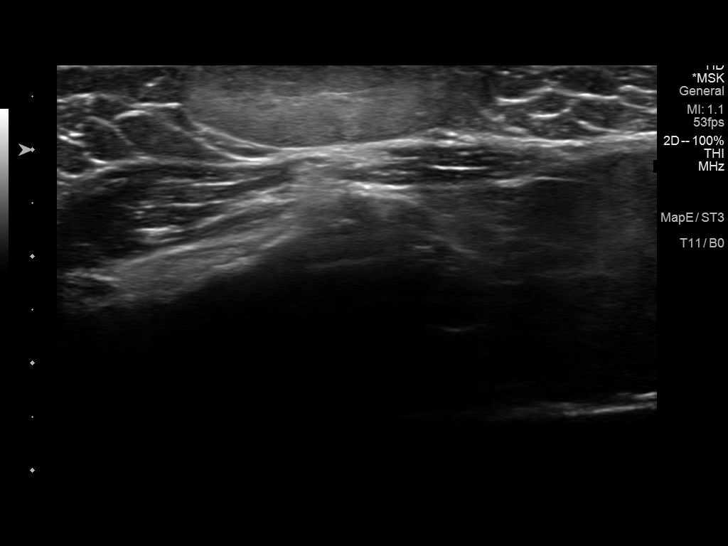
[im 5/14]
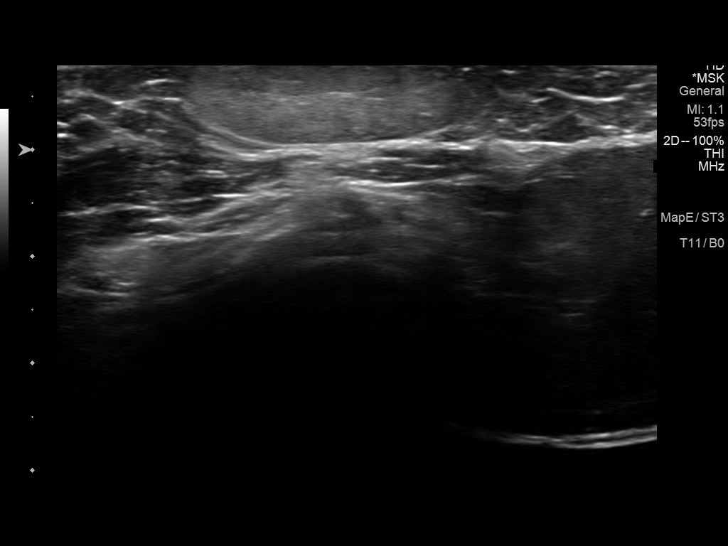
[im 6/14]
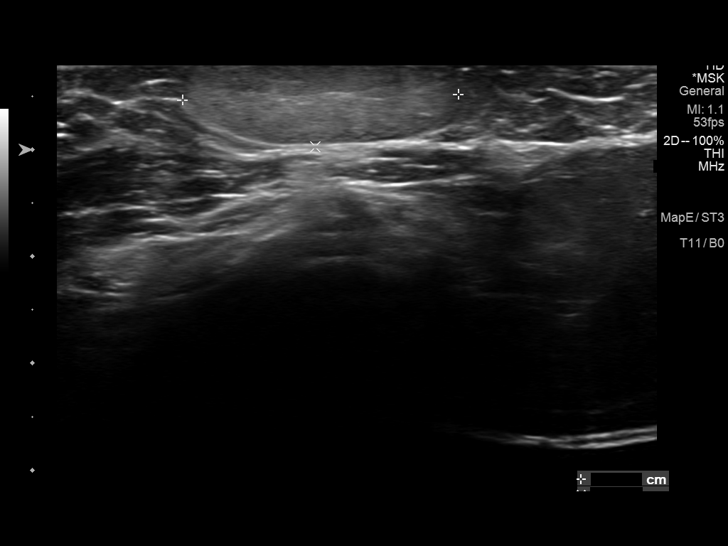
[im 7/14]
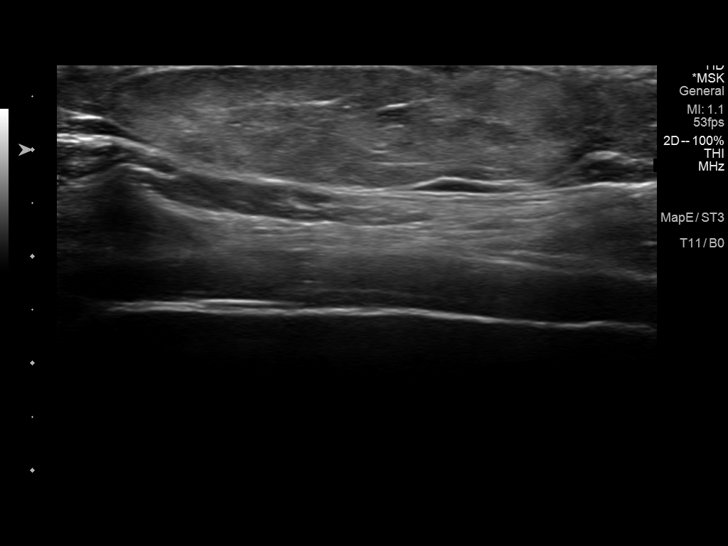
[im 8/14]
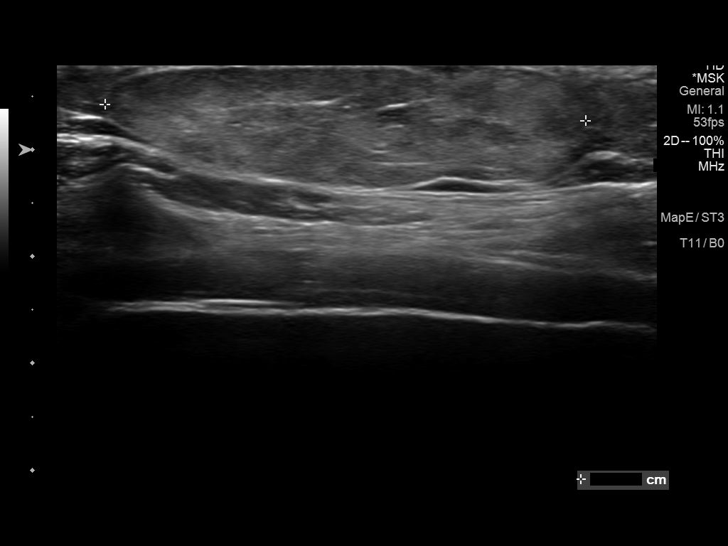
[im 9/14]
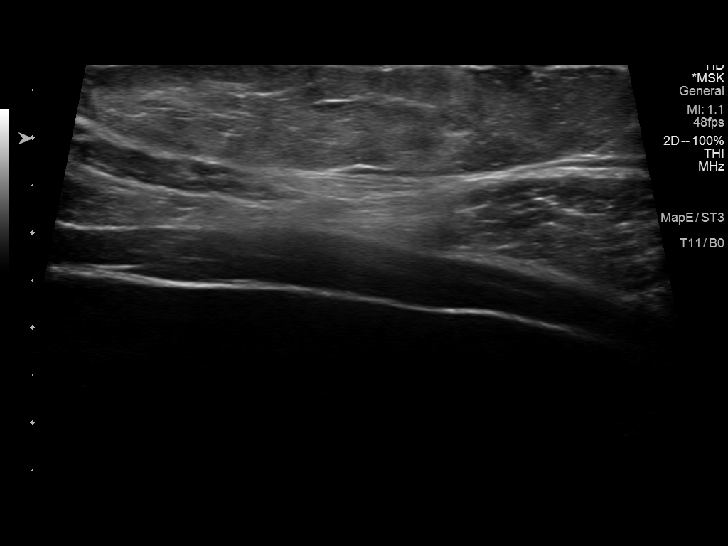
[im 10/14]
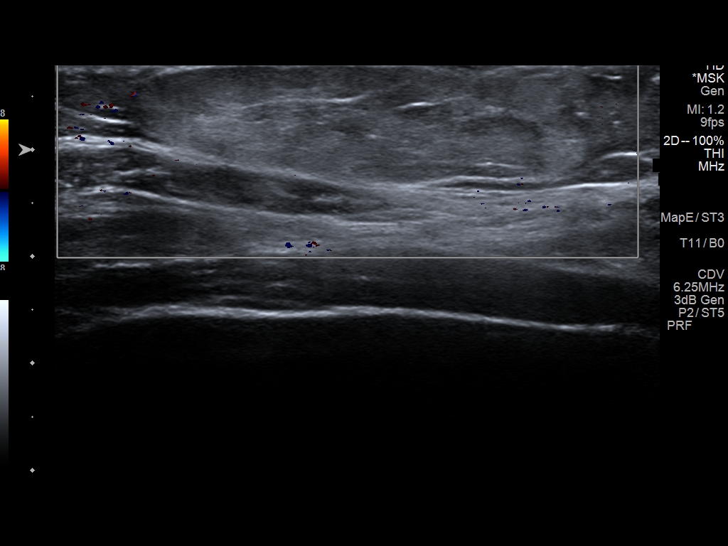
[im 11/14]
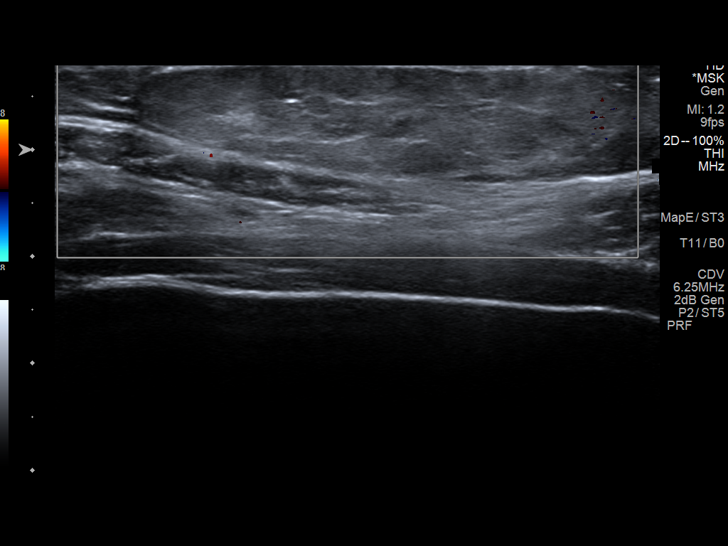
[im 12/14]
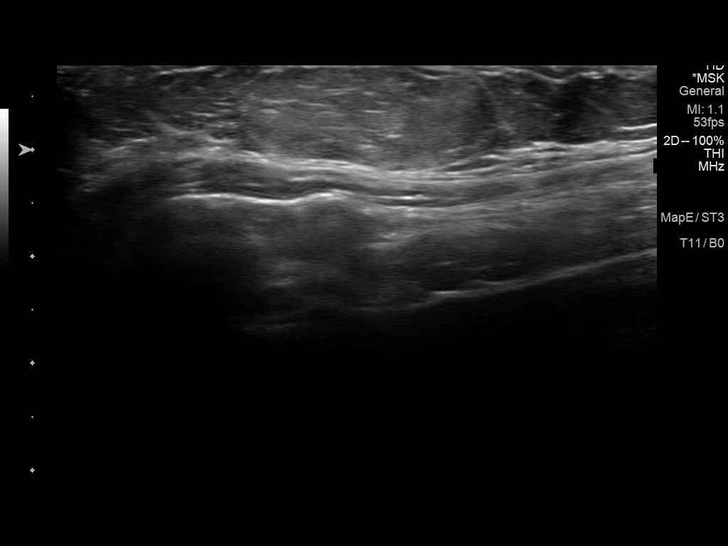
[im 13/14]
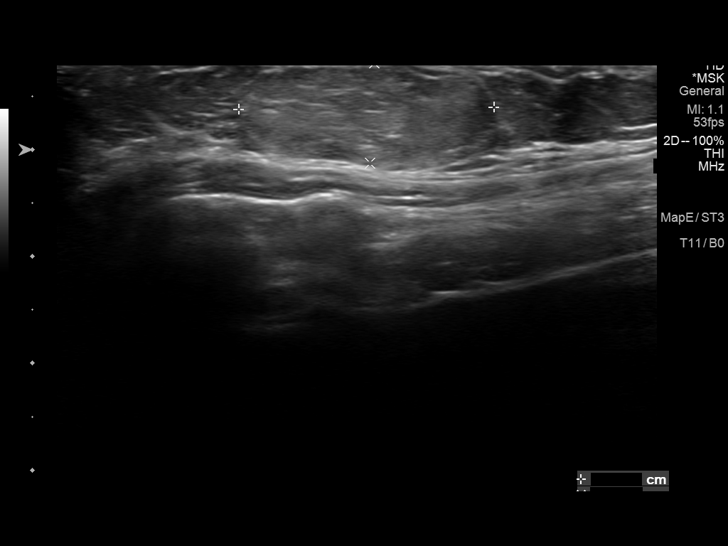
[im 14/14]
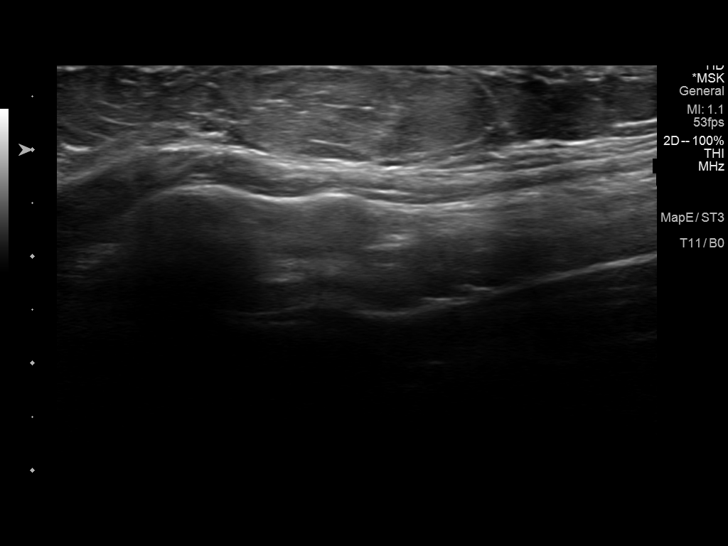

[14 of 14 positions shown; findings below may reference images not displayed]

FINDINGS: One mass is noted in the mid epigastric region and appears to be
solid and homogeneous. No color flow is noted on Doppler. It
measures 3.0 x 2.6 x 0.8 cm. Another similar-appearing mass without
Doppler flow is noted in the right upper quadrant just below the
ribs measuring 2.4 x 4.5 x 0.9 cm.
IMPRESSION: Two solid homogeneous masses are noted in anterior abdominal wall in
areas of palpable abnormalities. These most likely represent
lipomas, but further evaluation with MRI or CT scan is recommended
to rule out other pathology.

## 2017-03-23 ENCOUNTER — Emergency Department
Admission: EM | Admit: 2017-03-23 | Discharge: 2017-03-23 | Disposition: A | Discharge status: Home / Self Care | Payer: 59 | Source: Home / Self Care | Attending: Emergency Medicine | Admitting: Emergency Medicine

## 2017-03-23 ENCOUNTER — Encounter: Payer: Self-pay | Admitting: Emergency Medicine

## 2017-03-23 ENCOUNTER — Other Ambulatory Visit: Payer: Self-pay

## 2017-03-23 DIAGNOSIS — J069 Acute upper respiratory infection, unspecified: Secondary | ICD-10-CM

## 2017-03-23 LAB — POCT INFLUENZA A/B
Influenza A, POC: NEGATIVE
Influenza B, POC: NEGATIVE

## 2017-03-23 LAB — POCT RAPID STREP A (OFFICE): RAPID STREP A SCREEN: NEGATIVE

## 2017-03-23 NOTE — ED Provider Notes (Addendum)
[name]    CSN: [id number] Arrival date & time: 03/23/17  0945     History   Chief Complaint Chief Complaint  Patient presents with  . Generalized Body Aches  . Headache  . Sore Throat  . Otalgia    HPI Micheal Brewer is a 42 y.o. male.  Patient began feeling bad on Tuesday. He started with rhinorrhea followed by an itching sensation in his ears. Yesterday he developed a sore throat that was associated with chills headache and body aches. He has had no cough. He did have a flu shot last year. He has not been coughing up phlegm.  Headache  Associated symptoms: congestion, cough, ear pain, fatigue and sore throat   Associated symptoms: no fever   Sore Throat  Associated symptoms include headaches. Pertinent negatives include no shortness of breath.  Otalgia  Associated symptoms: congestion, cough, headaches and sore throat   Associated symptoms: no fever     History reviewed. No pertinent past medical history.  Patient Active Problem List   Diagnosis Date Noted  . Closed nondisplaced fracture of body of right hamate bone 03/11/2013    Past Surgical History:  Procedure Laterality Date  . NO PAST SURGERIES         Home Medications    Prior to Admission medications   Medication Sig Start Date End Date Taking? Authorizing Provider  clindamycin (CLEOCIN) 300 MG capsule Take one cap by mouth every 8 hours 07/22/16   Kandra Nicolas, MD  sulfacetamide (BLEPH-10) 10 % ophthalmic solution Place 1-2 drops into the right eye every 3 (three) hours while awake. 07/22/16   Kandra Nicolas, MD    Family History Family History  Problem Relation Age of Onset  . Heart failure Mother   . Hypertension Brother     Social History Social History   Tobacco Use  . Smoking status: Never Smoker  . Smokeless tobacco: Never Used  Substance Use Topics  . Alcohol use: Yes    Alcohol/week: 3.0 - 4.0 oz    Types: 6 - 8 Standard drinks or equivalent per week  .  Drug use: No     Allergies   Penicillins   Review of Systems Review of Systems  Constitutional: Positive for chills and fatigue. Negative for fever.  HENT: Positive for congestion, ear pain and sore throat.   Eyes: Negative.   Respiratory: Positive for cough. Negative for shortness of breath.   Cardiovascular: Negative.   Gastrointestinal: Negative.   Neurological: Positive for headaches.     Physical Exam Triage Vital Signs ED Triage Vitals  Enc Vitals Group     BP 03/23/17 1017 128/82     Pulse Rate 03/23/17 1017 95     Resp 03/23/17 1017 18     Temp 03/23/17 1017 98.4 F (36.9 C)     Temp Source 03/23/17 1017 Oral     SpO2 03/23/17 1017 97 %     Weight 03/23/17 1018 179 lb (81.2 kg)     Height 03/23/17 1018 6' (1.829 m)     Head Circumference --      Peak Flow --      Pain Score 03/23/17 1018 0     Pain Loc --      Pain Edu? --      Excl. in [place]? --    No data found.  Updated Vital Signs BP 128/82 (BP Location: Right Arm)   Pulse 95   Temp 98.4 F (  36.9 C) (Oral)   Resp 18   Ht 6' (1.829 m)   Wt 179 lb (81.2 kg)   SpO2 97%   BMI 24.28 kg/m   Visual Acuity Right Eye Distance:   Left Eye Distance:   Bilateral Distance:    Right Eye Near:   Left Eye Near:    Bilateral Near:     Physical Exam  Constitutional: He appears well-developed and well-nourished.  HENT:  Head: Normocephalic and atraumatic.  Mouth/Throat: Oropharynx is clear and moist.  Eyes: EOM are normal. Pupils are equal, round, and reactive to light.  Neck: Normal range of motion. Neck supple.  Cardiovascular: Normal rate.  Pulmonary/Chest: Effort normal and breath sounds normal.  Abdominal: Soft.  Skin:  There is a 1 x 1 cm pustule left lower back.     UC Treatments / Results  Labs (all labs ordered are listed, but only abnormal results are displayed) Labs Reviewed  POCT RAPID STREP A (OFFICE)  POCT INFLUENZA A/B    EKG  EKG Interpretation None       Radiology No  results found.  Procedures Procedures (including critical care time)  Medications Ordered in UC Medications - No data to display   Initial Impression / Assessment and Plan / UC Course  I have reviewed the triage vital signs and the nursing notes.  Pertinent labs & imaging results that were available during my care of the patient were reviewed by me and considered in my medical decision making (see chart for details). Flu test and strep test negative. Will treat symptomatically with fluids rest Tylenol or ibuprofen. He is taken out of work. Return to clinic if worsening symptoms.      Final Clinical Impressions(s) / UC Diagnoses   Final diagnoses:  Upper respiratory tract infection, unspecified type    ED Discharge Orders    None     Please rest at home. Make sure he get in plenty of fluids. Take ibuprofen or Tylenol as needed for myalgias.  Controlled Substance Prescriptions [place] Controlled Substance Registry consulted? Not Applicable   Darlyne Russian, MD 03/23/17 1059    Darlyne Russian, MD 03/23/17 1059

## 2017-03-23 NOTE — Discharge Instructions (Signed)
Please rest at home. Make sure he get in plenty of fluids. Take ibuprofen or Tylenol as needed for myalgias.

## 2017-03-23 NOTE — ED Triage Notes (Signed)
Pt c/o runny nose, HA, sore throat, itchy ears, body aches and chills x 2 days. Denies fever.

## 2017-03-26 ENCOUNTER — Emergency Department (INDEPENDENT_AMBULATORY_CARE_PROVIDER_SITE_OTHER): Payer: 59

## 2017-03-26 ENCOUNTER — Encounter: Payer: Self-pay | Admitting: Emergency Medicine

## 2017-03-26 ENCOUNTER — Emergency Department
Admission: EM | Admit: 2017-03-26 | Discharge: 2017-03-26 | Disposition: A | Discharge status: Home / Self Care | Payer: 59 | Source: Home / Self Care | Attending: Emergency Medicine | Admitting: Emergency Medicine

## 2017-03-26 DIAGNOSIS — R05 Cough: Secondary | ICD-10-CM | POA: Diagnosis not present

## 2017-03-26 DIAGNOSIS — B9789 Other viral agents as the cause of diseases classified elsewhere: Secondary | ICD-10-CM

## 2017-03-26 DIAGNOSIS — J069 Acute upper respiratory infection, unspecified: Secondary | ICD-10-CM | POA: Diagnosis not present

## 2017-03-26 MED ORDER — HYDROCOD POLST-CPM POLST ER 10-8 MG/5ML PO SUER: 5.0000 mL | Freq: Every evening | ORAL | 0 refills | Status: DC | PRN

## 2017-03-26 MED ORDER — BENZONATATE 100 MG PO CAPS: 100.0000 mg | ORAL_CAPSULE | Freq: Three times a day (TID) | ORAL | 0 refills | Status: DC | PRN

## 2017-03-26 NOTE — Discharge Instructions (Signed)
Please drink good  amounts of water. Keep hard candy in your mouth for cough. Take cough syrup at bedtime. You also have cough capsules to take during the day.

## 2017-03-26 NOTE — ED Triage Notes (Signed)
Patient C/O continued cough times 1 week, he was seen here on Thursday for the same, Productive cough with white sputum C/O itchy ears, Swabbed on Thursday for flu and strep both were negative

## 2017-03-26 NOTE — ED Provider Notes (Addendum)
[facility]    CSN: [id number] Arrival date & time: 03/26/17  1407     History   Chief Complaint Chief Complaint  Patient presents with  . Cough    HPI Micheal Brewer is a 42 y.o. male.   HPI This is a follow-up visit for Micheal Brewer. He was seen here 2 days ago with respiratory symptoms. At that time flu test and strep tests were negative. Multiple family members at home have ben ill.is sore throat and ear itching has improved but he has persistent cough at night and is unable to rest. He states that his cough has been minimally productive. He denies fever chills or myalgias. History reviewed. No pertinent past medical history.  Patient Active Problem List   Diagnosis Date Noted  . Closed nondisplaced fracture of body of right hamate bone 03/11/2013    Past Surgical History:  Procedure Laterality Date  . NO PAST SURGERIES         Home Medications    Prior to Admission medications   Medication Sig Start Date End Date Taking? Authorizing Provider  benzonatate (TESSALON) 100 MG capsule Take 1-2 capsules (100-200 mg total) by mouth 3 (three) times daily as needed for cough. 03/26/17   Darlyne Russian, MD  chlorpheniramine-HYDROcodone (TUSSIONEX PENNKINETIC ER) 10-8 MG/5ML SUER Take 5 mLs by mouth at bedtime as needed for cough. 03/26/17   Darlyne Russian, MD  clindamycin (CLEOCIN) 300 MG capsule Take one cap by mouth every 8 hours 07/22/16   Kandra Nicolas, MD  sulfacetamide (BLEPH-10) 10 % ophthalmic solution Place 1-2 drops into the right eye every 3 (three) hours while awake. 07/22/16   Kandra Nicolas, MD    Family History Family History  Problem Relation Age of Onset  . Heart failure Mother   . Hypertension Brother     Social History Social History   Tobacco Use  . Smoking status: Never Smoker  . Smokeless tobacco: Never Used  Substance Use Topics  . Alcohol use: Yes    Alcohol/week: 3.0 - 4.0 oz    Types: 6 - 8 Standard drinks or equivalent per  week  . Drug use: No     Allergies   Penicillins   Review of Systems Review of Systems  Constitutional: Positive for fatigue. Negative for fever.  HENT: Positive for congestion and sore throat.   Eyes: Negative.   Respiratory: Positive for cough. Negative for shortness of breath and wheezing.   Cardiovascular: Negative.   Gastrointestinal: Negative.      Physical Exam Triage Vital Signs ED Triage Vitals [03/26/17 1454]  Enc Vitals Group     BP 135/85     Pulse Rate 88     Resp 16     Temp 98.2 F (36.8 C)     Temp Source Oral     SpO2 96 %     Weight 180 lb 8 oz (81.9 kg)     Height 6' (1.829 m)     Head Circumference      Peak Flow      Pain Score      Pain Loc      Pain Edu?      Excl. in [place]?    No data found.  Updated Vital Signs BP 135/85 (BP Location: Right Arm)   Pulse 88   Temp 98.2 F (36.8 C) (Oral)   Resp 16   Ht 6' (1.829 m)   Wt 180 lb 8 oz (81.9  kg)   SpO2 96%   BMI 24.48 kg/m   Visual Acuity Right Eye Distance:   Left Eye Distance:   Bilateral Distance:    Right Eye Near:   Left Eye Near:    Bilateral Near:     Physical Exam  Constitutional: He appears well-developed and well-nourished.  HENT:  Nose: Nose normal.  Mouth/Throat: Oropharynx is clear and moist.  TMs are clear.  Neck: Normal range of motion. Neck supple.  Cardiovascular: Normal rate and regular rhythm.  Pulmonary/Chest: Effort normal and breath sounds normal.     UC Treatments / Results  Labs (all labs ordered are listed, but only abnormal results are displayed) Labs Reviewed - No data to display  EKG  EKG Interpretation None       Radiology Dg Chest 2 View  Result Date: 03/26/2017 CLINICAL DATA:  Productive cough for a week EXAM: CHEST - 2 VIEW COMPARISON:  None. FINDINGS: The heart size and mediastinal contours are within normal limits. Both lungs are clear. The visualized skeletal structures are unremarkable. IMPRESSION: No active cardiopulmonary  disease.  No evidence of pneumonia. Electronically Signed   By: Franki Cabot M.D.   On: 03/26/2017 15:20    Procedures Procedures (including critical care time)  Medications Ordered in UC Medications - No data to display   Initial Impression / Assessment and Plan / UC Course  I have reviewed the triage vital signs and the nursing notes.  Pertinent labs & imaging results that were available during my care of the patient were reviewed by me and considered in my medical decision making (see chart for details). Chest x-ray shows no evidence of pneumonia. His exam is normal. Will do symptomatic treatment with Tessalon Perles during the day and Tussionex at night.I told him that if the cough is persistent he may need a short taper of prednisone      Final Clinical Impressions(s) / UC Diagnoses   Final diagnoses:  Viral URI with cough    ED Discharge Orders        Ordered    chlorpheniramine-HYDROcodone (TUSSIONEX PENNKINETIC ER) 10-8 MG/5ML SUER  At bedtime PRN     03/26/17 1523    benzonatate (TESSALON) 100 MG capsule  3 times daily PRN     03/26/17 1523     Please drink good  amounts of water. Keep hard candy in your mouth for cough. Take cough syrup at bedtime. You also have cough capsules to take during the day.  Controlled Substance Prescriptions [place] Controlled Substance Registry consulted? Yes, I have consulted the [place] Controlled Substances Registry for this patient, and feel the risk/benefit ratio today is favorable for proceeding with this prescription for a controlled substance.   Darlyne Russian, MD 03/26/17 1535    Darlyne Russian, MD 03/26/17 1600    Darlyne Russian, MD 03/26/17 [phone]

## 2017-03-31 ENCOUNTER — Telehealth: Payer: Self-pay

## 2017-03-31 NOTE — Telephone Encounter (Signed)
Dr Assunta Found ordered doxy 100 BID pc #14.  Called patient and notified that it was called into pharmacy.

## 2017-08-06 ENCOUNTER — Encounter: Payer: Self-pay | Admitting: Emergency Medicine

## 2017-08-06 ENCOUNTER — Other Ambulatory Visit: Payer: Self-pay

## 2017-08-06 ENCOUNTER — Emergency Department
Admission: EM | Admit: 2017-08-06 | Discharge: 2017-08-06 | Disposition: A | Discharge status: Home / Self Care | Payer: 59 | Source: Home / Self Care | Attending: Family Medicine | Admitting: Family Medicine

## 2017-08-06 DIAGNOSIS — H00022 Hordeolum internum right lower eyelid: Secondary | ICD-10-CM

## 2017-08-06 MED ORDER — SULFACETAMIDE SODIUM 10 % OP SOLN: 1.0000 [drp] | OPHTHALMIC | 0 refills | Status: DC

## 2017-08-06 MED ORDER — CLINDAMYCIN HCL 300 MG PO CAPS: ORAL_CAPSULE | ORAL | 0 refills | Status: DC

## 2017-08-06 NOTE — ED Triage Notes (Signed)
Patient reports tenderness in right eye lids for past week; treating with warm compresses since familiar with having styes; today eye lids and conjunctivitis exacerbated.

## 2017-08-06 NOTE — Discharge Instructions (Addendum)
Continue warm compress several times daily.

## 2017-08-06 NOTE — ED Provider Notes (Signed)
[name]    CSN: [id number] Arrival date & time: 08/06/17  1510     History   Chief Complaint Chief Complaint  Patient presents with  . Eye Problem    right    HPI Micheal Brewer is a 42 y.o. male.   Patient complains of mild swelling in his right upper eyelid for about a week that has become somewhat worse today.  He has had a stye before and this feels similar.  He notes minimal swelling and pain.  No change in vision.  The history is provided by the patient.  Eye Problem  Location:  Right eye Quality:  Dull Severity:  Mild Onset quality:  Gradual Duration:  1 week Timing:  Constant Progression:  Worsening Chronicity:  Recurrent Context: not burn, not chemical exposure, not contact lens problem, not direct trauma, not foreign body, not using machinery, not scratch, not smoke exposure and not UV exposure   Relieved by:  Nothing Worsened by:  Nothing Ineffective treatments: warm compresses. Associated symptoms: discharge, inflammation, redness, swelling and tearing   Associated symptoms: no blurred vision, no crusting, no decreased vision, no double vision, no facial rash, no headaches, no itching, no photophobia and no scotomas   Risk factors: no exposure to pinkeye and no recent URI     History reviewed. No pertinent past medical history.  Patient Active Problem List   Diagnosis Date Noted  . Closed nondisplaced fracture of body of right hamate bone 03/11/2013    Past Surgical History:  Procedure Laterality Date  . NO PAST SURGERIES         Home Medications    Prior to Admission medications   Medication Sig Start Date End Date Taking? Authorizing Provider  benzonatate (TESSALON) 100 MG capsule Take 1-2 capsules (100-200 mg total) by mouth 3 (three) times daily as needed for cough. 03/26/17   Darlyne Russian, MD  chlorpheniramine-HYDROcodone (TUSSIONEX PENNKINETIC ER) 10-8 MG/5ML SUER Take 5 mLs by mouth at bedtime as needed for cough.  03/26/17   Darlyne Russian, MD  clindamycin (CLEOCIN) 300 MG capsule Take one cap by mouth every 8 hours 08/06/17   Kandra Nicolas, MD  sulfacetamide (BLEPH-10) 10 % ophthalmic solution Place 1-2 drops into the right eye every 3 (three) hours while awake. 08/06/17   Assunta Found Ishmael Holter, MD    Family History Family History  Problem Relation Age of Onset  . Heart failure Mother   . Hypertension Brother     Social History Social History   Tobacco Use  . Smoking status: Never Smoker  . Smokeless tobacco: Never Used  Substance Use Topics  . Alcohol use: Yes    Alcohol/week: 3.6 - 4.8 oz    Types: 6 - 8 Standard drinks or equivalent per week  . Drug use: No     Allergies   Penicillins   Review of Systems Review of Systems  Eyes: Positive for discharge and redness. Negative for blurred vision, double vision, photophobia and itching.  Neurological: Negative for headaches.  All other systems reviewed and are negative.    Physical Exam Triage Vital Signs ED Triage Vitals  Enc Vitals Group     BP 08/06/17 1536 137/86     Pulse Rate 08/06/17 1536 64     Resp 08/06/17 1536 16     Temp 08/06/17 1536 98.1 F (36.7 C)     Temp Source 08/06/17 1536 Oral     SpO2 08/06/17 1536 98 %  Weight 08/06/17 1537 177 lb (80.3 kg)     Height 08/06/17 1537 6' (1.829 m)     Head Circumference --      Peak Flow --      Pain Score 08/06/17 1537 1     Pain Loc --      Pain Edu? --      Excl. in [place]? --    No data found.  Updated Vital Signs BP 137/86 (BP Location: Right Arm)   Pulse 64   Temp 98.1 F (36.7 C) (Oral)   Resp 16   Ht 6' (1.829 m)   Wt 177 lb (80.3 kg)   SpO2 98%   BMI 24.01 kg/m   Visual Acuity Right Eye Distance:   Left Eye Distance:   Bilateral Distance:    Right Eye Near:   Left Eye Near:    Bilateral Near:     Physical Exam  Constitutional: He appears well-developed and well-nourished. No distress.  HENT:  Head: Normocephalic.  Right Ear: External ear  normal.  Left Ear: External ear normal.  Nose: Nose normal.  Mouth/Throat: Oropharynx is clear and moist.  Eyes: Pupils are equal, round, and reactive to light. Conjunctivae and EOM are normal. Lids are everted and swept, no foreign bodies found. Right eye exhibits hordeolum. Right eye exhibits no chemosis and no discharge. No foreign body present in the right eye. Left eye exhibits no chemosis and no discharge. No foreign body present in the left eye.    Tenderness, mild swelling, and erythema of right lower lateral eyelid as noted on diagram.   Neck: Neck supple.  Cardiovascular: Normal rate.  Pulmonary/Chest: Effort normal.  Musculoskeletal: He exhibits no edema.  Lymphadenopathy:    He has no cervical adenopathy.  Neurological: He is alert.  Skin: Skin is warm and dry.  Nursing note and vitals reviewed.    UC Treatments / Results  Labs (all labs ordered are listed, but only abnormal results are displayed) Labs Reviewed - No data to display  EKG None  Radiology No results found.  Procedures Procedures (including critical care time)  Medications Ordered in UC Medications - No data to display  Initial Impression / Assessment and Plan / UC Course  I have reviewed the triage vital signs and the nursing notes.  Pertinent labs & imaging results that were available during my care of the patient were reviewed by me and considered in my medical decision making (see chart for details).    Begin clindaymcin 300mg  TID and topical sulfacetamide ophthalmic suspension. Followup with ophthalmologist if not improved about 5 days.  Final Clinical Impressions(s) / UC Diagnoses   Final diagnoses:  Hordeolum internum of right lower eyelid     Discharge Instructions     Continue warm compress several times daily.    ED Prescriptions    Medication Sig Dispense Auth. Provider   clindamycin (CLEOCIN) 300 MG capsule Take one cap by mouth every 8 hours 21 capsule Kandra Nicolas, MD   sulfacetamide (BLEPH-10) 10 % ophthalmic solution Place 1-2 drops into the right eye every 3 (three) hours while awake. 5 mL Kandra Nicolas, MD        Kandra Nicolas, MD 08/08/17 [phone]

## 2017-10-28 ENCOUNTER — Other Ambulatory Visit: Payer: Self-pay

## 2017-10-28 ENCOUNTER — Emergency Department
Admission: EM | Admit: 2017-10-28 | Discharge: 2017-10-28 | Disposition: A | Discharge status: Home / Self Care | Payer: 59 | Source: Home / Self Care | Attending: Emergency Medicine | Admitting: Emergency Medicine

## 2017-10-28 DIAGNOSIS — H00012 Hordeolum externum right lower eyelid: Secondary | ICD-10-CM

## 2017-10-28 MED ORDER — DOXYCYCLINE HYCLATE 100 MG PO CAPS: 100.0000 mg | ORAL_CAPSULE | Freq: Two times a day (BID) | ORAL | 0 refills | Status: DC

## 2017-10-28 MED ORDER — OFLOXACIN 0.3 % OP SOLN: 1.0000 [drp] | Freq: Four times a day (QID) | OPHTHALMIC | 0 refills | Status: DC

## 2017-10-28 NOTE — Discharge Instructions (Addendum)
Clean lids with Johnson's baby shampoo once a day. Take antibiotics as instructed. Use drops for your infection. We will call you with culture results.

## 2017-10-28 NOTE — ED Triage Notes (Signed)
Pt noticed a "white head" in eye lid about midweek - used warm compresses and noticed some drainage, but now has started to get red and "goopy".

## 2017-10-28 NOTE — ED Provider Notes (Signed)
[name]    CSN: [id number] Arrival date & time: 10/28/17  1042     History   Chief Complaint Chief Complaint  Patient presents with  . Stye    RT eye    HPI Micheal Brewer is a 42 y.o. male.  Patient has a history of styes involving his right. He was treated recently with Cleocin and sulfa drops. He enters today with a one-week history of drainage from a stye of his right lower lid. He denies difficulty with vision. He has not had t pain but does have significant drainage from his eye. HPI  History reviewed. No pertinent past medical history.  Patient Active Problem List   Diagnosis Date Noted  . Closed nondisplaced fracture of body of right hamate bone 03/11/2013    Past Surgical History:  Procedure Laterality Date  . NO PAST SURGERIES         Home Medications    Prior to Admission medications   Medication Sig Start Date End Date Taking? Authorizing Provider  benzonatate (TESSALON) 100 MG capsule Take 1-2 capsules (100-200 mg total) by mouth 3 (three) times daily as needed for cough. 03/26/17   Darlyne Russian, MD  chlorpheniramine-HYDROcodone (TUSSIONEX PENNKINETIC ER) 10-8 MG/5ML SUER Take 5 mLs by mouth at bedtime as needed for cough. 03/26/17   Darlyne Russian, MD  clindamycin (CLEOCIN) 300 MG capsule Take one cap by mouth every 8 hours 08/06/17   Kandra Nicolas, MD  doxycycline (VIBRAMYCIN) 100 MG capsule Take 1 capsule (100 mg total) by mouth 2 (two) times daily. 10/28/17   Darlyne Russian, MD  ofloxacin (OCUFLOX) 0.3 % ophthalmic solution Place 1 drop into the right eye 4 (four) times daily. 10/28/17   Darlyne Russian, MD  sulfacetamide (BLEPH-10) 10 % ophthalmic solution Place 1-2 drops into the right eye every 3 (three) hours while awake. 08/06/17   Assunta Found Ishmael Holter, MD    Family History Family History  Problem Relation Age of Onset  . Heart failure Mother   . Hypertension Brother     Social History Social History   Tobacco Use  .  Smoking status: Never Smoker  . Smokeless tobacco: Never Used  Substance Use Topics  . Alcohol use: Yes    Alcohol/week: 6.0 - 8.0 standard drinks    Types: 6 - 8 Standard drinks or equivalent per week  . Drug use: No     Allergies   Penicillins   Review of Systems Review of Systems  Constitutional: Negative.   HENT: Negative.   Eyes: Positive for pain, discharge and redness. Negative for photophobia and visual disturbance.  Respiratory: Negative.   Cardiovascular: Negative.      Physical Exam Triage Vital Signs ED Triage Vitals  Enc Vitals Group     BP 10/28/17 1055 (!) 150/85     Pulse Rate 10/28/17 1055 67     Resp --      Temp 10/28/17 1055 98.1 F (36.7 C)     Temp Source 10/28/17 1055 Oral     SpO2 10/28/17 1055 98 %     Weight 10/28/17 1056 175 lb (79.4 kg)     Height 10/28/17 1056 6' (1.829 m)     Head Circumference --      Peak Flow --      Pain Score 10/28/17 1055 0     Pain Loc --      Pain Edu? --      Excl. in  [place]? --    No data found.  Updated Vital Signs BP (!) 150/85 (BP Location: Right Arm)   Pulse 67   Temp 98.1 F (36.7 C) (Oral)   Ht 6' (1.829 m)   Wt 79.4 kg   SpO2 98%   BMI 23.73 kg/m   Visual Acuity Right Eye Distance: 20/20 Left Eye Distance: 20/15 Bilateral Distance: 20/15  Right Eye Near:   Left Eye Near:    Bilateral Near:     Physical Exam  Constitutional: He appears well-developed and well-nourished.  HENT:  Head: Normocephalic and atraumatic.  Eyes: Pupils are equal, round, and reactive to light. EOM are normal.  The conjunctiva is injected. There is a small stye right upper lid. Evaluation of the right lower lid reveals a purulent drainage with a stye present midportion of the right lower lid. Culture was done of this area.  Neck: Normal range of motion. Neck supple.     UC Treatments / Results  Labs (all labs ordered are listed, but only abnormal results are displayed) Labs Reviewed  WOUND CULTURE     EKG None  Radiology No results found.  Procedures Procedures (including critical care time)  Medications Ordered in UC Medications - No data to display  Initial Impression / Assessment and Plan / UC Course  I have reviewed the triage vital signs and the nursing notes.  Pertinent labs & imaging results that were available during my care of the patient were reviewed by me and considered in my medical decision making (see chart for details). Patient has a angry appearing stye right lower lid with drainage.Culture done.Will treat with Ocuflox and doxycycline pending culture results     Final Clinical Impressions(s) / UC Diagnoses   Final diagnoses:  Hordeolum externum of right lower eyelid     Discharge Instructions     Clean lives with Johnson's baby shampoo once a day. Take antibiotics as instructed. Use drops for your infection. We will call you with culture results.    ED Prescriptions    Medication Sig Dispense Auth. Provider   doxycycline (VIBRAMYCIN) 100 MG capsule Take 1 capsule (100 mg total) by mouth 2 (two) times daily. 14 capsule Darlyne Russian, MD   ofloxacin (OCUFLOX) 0.3 % ophthalmic solution Place 1 drop into the right eye 4 (four) times daily. 5 mL Darlyne Russian, MD     Controlled Substance Prescriptions [place] Controlled Substance Registry consulted? Not Applicable   Darlyne Russian, MD 10/28/17 1625

## 2017-10-31 LAB — WOUND CULTURE
MICRO NUMBER:: 91230500
RESULT:: NO GROWTH
SPECIMEN QUALITY: ADEQUATE

## 2017-11-01 ENCOUNTER — Telehealth: Payer: Self-pay | Admitting: Emergency Medicine

## 2017-11-01 NOTE — Telephone Encounter (Signed)
Message left on voice mail inquiring about patient's status and encouraging patient to call with questions/concerns; told him to complete rx for ABX despite negative wound culture results.

## 2017-11-03 ENCOUNTER — Telehealth: Payer: Self-pay | Admitting: Family Medicine

## 2017-11-03 DIAGNOSIS — Z0000 Encounter for general adult medical examination without abnormal findings: Secondary | ICD-10-CM

## 2017-11-03 NOTE — Telephone Encounter (Signed)
Labs ordered. He can go at his convenience.   

## 2017-11-03 NOTE — Telephone Encounter (Signed)
Faxed. Please advise pt

## 2017-11-03 NOTE — Telephone Encounter (Signed)
Pt called.  He has scheduled his physical for Nov 11th.  He would like to have his labs drawn on  Nov 8th. I told him we would fax orders down to lab.  Thanks.

## 2017-11-06 NOTE — Telephone Encounter (Signed)
Pt informed. Thanks

## 2017-11-25 LAB — LIPID PANEL
CHOL/HDL RATIO: 4.2 (calc) (ref <5.0)
CHOLESTEROL: 191 mg/dL (ref <200)
HDL: 45 mg/dL (ref >40)
LDL Cholesterol (Calc): 121 mg/dL (calc) — ABNORMAL HIGH
NON-HDL CHOLESTEROL (CALC): 146 mg/dL — AB (ref <130)
Triglycerides: 131 mg/dL (ref <150)

## 2017-11-25 LAB — COMPLETE METABOLIC PANEL WITH GFR
AG RATIO: 1.7 (calc) (ref 1.0–2.5)
ALBUMIN MSPROF: 4.3 g/dL (ref 3.6–5.1)
ALKALINE PHOSPHATASE (APISO): 55 U/L (ref 40–115)
ALT: 11 U/L (ref 9–46)
AST: 18 U/L (ref 10–40)
BUN: 15 mg/dL (ref 7–25)
CO2: 26 mmol/L (ref 20–32)
CREATININE: 1.28 mg/dL (ref 0.60–1.35)
Calcium: 9.6 mg/dL (ref 8.6–10.3)
Chloride: 106 mmol/L (ref 98–110)
GFR, Est African American: 79 mL/min/{1.73_m2} (ref >=60)
GFR, Est Non African American: 69 mL/min/{1.73_m2} (ref >=60)
GLOBULIN: 2.6 g/dL (ref 1.9–3.7)
Glucose, Bld: 91 mg/dL (ref 65–99)
POTASSIUM: 3.9 mmol/L (ref 3.5–5.3)
SODIUM: 141 mmol/L (ref 135–146)
Total Bilirubin: 0.7 mg/dL (ref 0.2–1.2)
Total Protein: 6.9 g/dL (ref 6.1–8.1)

## 2017-11-25 LAB — CBC
HCT: 40.1 % (ref 38.5–50.0)
HEMOGLOBIN: 14.2 g/dL (ref 13.2–17.1)
MCH: 32.6 pg (ref 27.0–33.0)
MCHC: 35.4 g/dL (ref 32.0–36.0)
MCV: 92.2 fL (ref 80.0–100.0)
MPV: 10.1 fL (ref 7.5–12.5)
PLATELETS: 246 10*3/uL (ref 140–400)
RBC: 4.35 10*6/uL (ref 4.20–5.80)
RDW: 12.1 % (ref 11.0–15.0)
WBC: 6.1 10*3/uL (ref 3.8–10.8)

## 2017-11-27 ENCOUNTER — Encounter: Payer: Self-pay | Admitting: Family Medicine

## 2017-11-27 ENCOUNTER — Ambulatory Visit (INDEPENDENT_AMBULATORY_CARE_PROVIDER_SITE_OTHER): Payer: 59 | Admitting: Family Medicine

## 2017-11-27 VITALS — BP 142/78 | HR 75 | Ht 70.87 in | Wt 175.0 lb

## 2017-11-27 DIAGNOSIS — Z23 Encounter for immunization: Secondary | ICD-10-CM

## 2017-11-27 DIAGNOSIS — Z0000 Encounter for general adult medical examination without abnormal findings: Secondary | ICD-10-CM | POA: Diagnosis not present

## 2017-11-27 DIAGNOSIS — R030 Elevated blood-pressure reading, without diagnosis of hypertension: Secondary | ICD-10-CM | POA: Diagnosis not present

## 2017-11-27 NOTE — Progress Notes (Signed)
Subjective:    CC: CPE  HPI:42 yo male is here today for CPE.  He says he actually came in for physical today because he is actually concerned about his blood pressure.  He recently went to urgent care twice in the last couple of months for a stye on his lower lid on his right eye.  Both times his blood pressure was mildly elevated.  He says his father and brother both have high blood pressure and his mother died from heart failure.  BP (!) 142/78   Pulse 75   Ht 5' 10.87" (1.8 m)   Wt 175 lb (79.4 kg)   SpO2 100%   BMI 24.50 kg/m     Allergies  Allergen Reactions  . Penicillins     REACTION: rash    No past medical history on file.  Past Surgical History:  Procedure Laterality Date  . NO PAST SURGERIES      Social History   Socioeconomic History  . Marital status: Married    Spouse name: Crystal  . Number of children: 1  . Years of education: Not on file  . Highest education level: Not on file  Occupational History  . Occupation: Curator: UNEMPLOYED  Social Needs  . Financial resource strain: Not on file  . Food insecurity:    Worry: Not on file    Inability: Not on file  . Transportation needs:    Medical: Not on file    Non-medical: Not on file  Tobacco Use  . Smoking status: Never Smoker  . Smokeless tobacco: Never Used  Substance and Sexual Activity  . Alcohol use: Yes    Alcohol/week: 6.0 - 8.0 standard drinks    Types: 6 - 8 Standard drinks or equivalent per week  . Drug use: No  . Sexual activity: Not on file  Lifestyle  . Physical activity:    Days per week: Not on file    Minutes per session: Not on file  . Stress: Not on file  Relationships  . Social connections:    Talks on phone: Not on file    Gets together: Not on file    Attends religious service: Not on file    Active member of club or organization: Not on file    Attends meetings of clubs or organizations: Not on file    Relationship status: Not on file  .  Intimate partner violence:    Fear of current or ex partner: Not on file    Emotionally abused: Not on file    Physically abused: Not on file    Forced sexual activity: Not on file  Other Topics Concern  . Not on file  Social History Narrative   Works out regularly.     Family History  Problem Relation Age of Onset  . Heart failure Mother   . Hypertension Brother   . Hypertension Father     Outpatient Encounter Medications as of 11/27/2017  Medication Sig  . [DISCONTINUED] ofloxacin (OCUFLOX) 0.3 % ophthalmic solution Place 1 drop into the right eye 4 (four) times daily.  . [DISCONTINUED] sulfacetamide (BLEPH-10) 10 % ophthalmic solution Place 1-2 drops into the right eye every 3 (three) hours while awake.   No facility-administered encounter medications on file as of 11/27/2017.       Review of Systems: No fevers, chills, night sweats, weight loss, chest pain, or shortness of breath.   Objective:    Physical Exam  Constitutional: He is oriented to person, place, and time. He appears well-developed and well-nourished.  HENT:  Head: Normocephalic and atraumatic.  Right Ear: External ear normal.  Left Ear: External ear normal.  Nose: Nose normal.  Mouth/Throat: Oropharynx is clear and moist.  Eyes: Pupils are equal, round, and reactive to light. Conjunctivae and EOM are normal.  Neck: Normal range of motion. Neck supple. No thyromegaly present.  Cardiovascular: Normal rate, regular rhythm, normal heart sounds and intact distal pulses.  Pulmonary/Chest: Effort normal and breath sounds normal.  Abdominal: Soft. Bowel sounds are normal. He exhibits no distension and no mass. There is no tenderness. There is no rebound and no guarding.  Musculoskeletal: Normal range of motion.  Lymphadenopathy:    He has no cervical adenopathy.  Neurological: He is alert and oriented to person, place, and time. He has normal reflexes.  Skin: Skin is warm and dry.  Psychiatric: He has a  normal mood and affect. His behavior is normal. Judgment and thought content normal.    Impression and Recommendations:   Wellness Exam  Keep up a regular exercise program and make sure you are eating a healthy diet Try to eat 4 servings of dairy a day, or if you are lactose intolerant take a calcium with vitamin D daily.  Your vaccines are up to date.   Tdap given today.     Elevated blood pressure-we discussed the DASH diet handout additional information provided.

## 2017-11-27 NOTE — Patient Instructions (Addendum)
Health Maintenance, Male A healthy lifestyle and preventive care is important for your health and wellness. Ask your health care provider about what schedule of regular examinations is right for you. What should I know about weight and diet? Eat a Healthy Diet  Eat plenty of vegetables, fruits, whole grains, low-fat dairy products, and lean protein.  Do not eat a lot of foods high in solid fats, added sugars, or salt.  Maintain a Healthy Weight Regular exercise can help you achieve or maintain a healthy weight. You should:  Do at least 150 minutes of exercise each week. The exercise should increase your heart rate and make you sweat (moderate-intensity exercise).  Do strength-training exercises at least twice a week.  Watch Your Levels of Cholesterol and Blood Lipids  Have your blood tested for lipids and cholesterol every 5 years starting at 42 years of age. If you are at high risk for heart disease, you should start having your blood tested when you are 42 years old. You may need to have your cholesterol levels checked more often if: ? Your lipid or cholesterol levels are high. ? You are older than 42 years of age. ? You are at high risk for heart disease.  What should I know about cancer screening? Many types of cancers can be detected early and may often be prevented. Lung Cancer  You should be screened every year for lung cancer if: ? You are a current smoker who has smoked for at least 30 years. ? You are a former smoker who has quit within the past 15 years.  Talk to your health care provider about your screening options, when you should start screening, and how often you should be screened.  Colorectal Cancer  Routine colorectal cancer screening usually begins at 42 years of age and should be repeated every 5-10 years until you are 42 years old. You may need to be screened more often if early forms of precancerous polyps or small growths are found. Your health care provider  may recommend screening at an earlier age if you have risk factors for colon cancer.  Your health care provider may recommend using home test kits to check for hidden blood in the stool.  A small camera at the end of a tube can be used to examine your colon (sigmoidoscopy or colonoscopy). This checks for the earliest forms of colorectal cancer.  Prostate and Testicular Cancer  Depending on your age and overall health, your health care provider may do certain tests to screen for prostate and testicular cancer.  Talk to your health care provider about any symptoms or concerns you have about testicular or prostate cancer.  Skin Cancer  Check your skin from head to toe regularly.  Tell your health care provider about any new moles or changes in moles, especially if: ? There is a change in a mole's size, shape, or color. ? You have a mole that is larger than a pencil eraser.  Always use sunscreen. Apply sunscreen liberally and repeat throughout the day.  Protect yourself by wearing long sleeves, pants, a wide-brimmed hat, and sunglasses when outside.  What should I know about heart disease, diabetes, and high blood pressure?  If you are 18-39 years of age, have your blood pressure checked every 3-5 years. If you are 40 years of age or older, have your blood pressure checked every year. You should have your blood pressure measured twice-once when you are at a hospital or clinic, and once when   you are not at a hospital or clinic. Record the average of the two measurements. To check your blood pressure when you are not at a hospital or clinic, you can use: ? An automated blood pressure machine at a pharmacy. ? A home blood pressure monitor.  Talk to your health care provider about your target blood pressure.  If you are between 45-79 years old, ask your health care provider if you should take aspirin to prevent heart disease.  Have regular diabetes screenings by checking your fasting blood  sugar level. ? If you are at a normal weight and have a low risk for diabetes, have this test once every three years after the age of 45. ? If you are overweight and have a high risk for diabetes, consider being tested at a younger age or more often.  A one-time screening for abdominal aortic aneurysm (AAA) by ultrasound is recommended for men aged 65-75 years who are current or former smokers. What should I know about preventing infection? Hepatitis B If you have a higher risk for hepatitis B, you should be screened for this virus. Talk with your health care provider to find out if you are at risk for hepatitis B infection. Hepatitis C Blood testing is recommended for:  Everyone born from 1945 through 1965.  Anyone with known risk factors for hepatitis C.  Sexually Transmitted Diseases (STDs)  You should be screened each year for STDs including gonorrhea and chlamydia if: ? You are sexually active and are younger than 42 years of age. ? You are older than 42 years of age and your health care provider tells you that you are at risk for this type of infection. ? Your sexual activity has changed since you were last screened and you are at an increased risk for chlamydia or gonorrhea. Ask your health care provider if you are at risk.  Talk with your health care provider about whether you are at high risk of being infected with HIV. Your health care provider may recommend a prescription medicine to help prevent HIV infection.  What else can I do?  Schedule regular health, dental, and eye exams.  Stay current with your vaccines (immunizations).  Do not use any tobacco products, such as cigarettes, chewing tobacco, and e-cigarettes. If you need help quitting, ask your health care provider.  Limit alcohol intake to no more than 2 drinks per day. One drink equals 12 ounces of beer, 5 ounces of wine, or 1 ounces of hard liquor.  Do not use street drugs.  Do not share needles.  Ask your  health care provider for help if you need support or information about quitting drugs.  Tell your health care provider if you often feel depressed.  Tell your health care provider if you have ever been abused or do not feel safe at home. This information is not intended to replace advice given to you by your health care provider. Make sure you discuss any questions you have with your health care provider. Document Released: 07/02/2007 Document Revised: 09/02/2015 Document Reviewed: 10/07/2014 Elsevier Interactive Patient Education  2018 [employer]  DASH Eating Plan DASH stands for "Dietary Approaches to Stop Hypertension." The DASH eating plan is a healthy eating plan that has been shown to reduce high blood pressure (hypertension). It may also reduce your risk for type 2 diabetes, heart disease, and stroke. The DASH eating plan may also help with weight loss. What are tips for following this plan? General guidelines    Avoid eating more than 2,300 mg (milligrams) of salt (sodium) a day. If you have hypertension, you may need to reduce your sodium intake to 1,500 mg a day.  Limit alcohol intake to no more than 1 drink a day for nonpregnant women and 2 drinks a day for men. One drink equals 12 oz of beer, 5 oz of wine, or 1 oz of hard liquor.  Work with your health care provider to maintain a healthy body weight or to lose weight. Ask what an ideal weight is for you.  Get at least 30 minutes of exercise that causes your heart to beat faster (aerobic exercise) most days of the week. Activities may include walking, swimming, or biking.  Work with your health care provider or diet and nutrition specialist (dietitian) to adjust your eating plan to your individual calorie needs. Reading food labels  Check food labels for the amount of sodium per serving. Choose foods with less than 5 percent of the Daily Value of sodium. Generally, foods with less than 300 mg of sodium per serving fit into this  eating plan.  To find whole grains, look for the word "whole" as the first word in the ingredient list. Shopping  Buy products labeled as "low-sodium" or "no salt added."  Buy fresh foods. Avoid canned foods and premade or frozen meals. Cooking  Avoid adding salt when cooking. Use salt-free seasonings or herbs instead of table salt or sea salt. Check with your health care provider or pharmacist before using salt substitutes.  Do not fry foods. Cook foods using healthy methods such as baking, boiling, grilling, and broiling instead.  Cook with heart-healthy oils, such as olive, canola, soybean, or sunflower oil. Meal planning   Eat a balanced diet that includes: ? 5 or more servings of fruits and vegetables each day. At each meal, try to fill half of your plate with fruits and vegetables. ? Up to 6-8 servings of whole grains each day. ? Less than 6 oz of lean meat, poultry, or fish each day. A 3-oz serving of meat is about the same size as a deck of cards. One egg equals 1 oz. ? 2 servings of low-fat dairy each day. ? A serving of nuts, seeds, or beans 5 times each week. ? Heart-healthy fats. Healthy fats called Omega-3 fatty acids are found in foods such as flaxseeds and coldwater fish, like sardines, salmon, and mackerel.  Limit how much you eat of the following: ? Canned or prepackaged foods. ? Food that is high in trans fat, such as fried foods. ? Food that is high in saturated fat, such as fatty meat. ? Sweets, desserts, sugary drinks, and other foods with added sugar. ? Full-fat dairy products.  Do not salt foods before eating.  Try to eat at least 2 vegetarian meals each week.  Eat more home-cooked food and less restaurant, buffet, and fast food.  When eating at a restaurant, ask that your food be prepared with less salt or no salt, if possible. What foods are recommended? The items listed may not be a complete list. Talk with your dietitian about what dietary choices  are best for you. Grains Whole-grain or whole-wheat bread. Whole-grain or whole-wheat pasta. Brown rice. Oatmeal. Quinoa. Bulgur. Whole-grain and low-sodium cereals. Pita bread. Low-fat, low-sodium crackers. Whole-wheat flour tortillas. Vegetables Fresh or frozen vegetables (raw, steamed, roasted, or grilled). Low-sodium or reduced-sodium tomato and vegetable juice. Low-sodium or reduced-sodium tomato sauce and tomato paste. Low-sodium or reduced-sodium canned vegetables. Fruits   All fresh, dried, or frozen fruit. Canned fruit in natural juice (without added sugar). Meat and other protein foods Skinless chicken or turkey. Ground chicken or turkey. Pork with fat trimmed off. Fish and seafood. Egg whites. Dried beans, peas, or lentils. Unsalted nuts, nut butters, and seeds. Unsalted canned beans. Lean cuts of beef with fat trimmed off. Low-sodium, lean deli meat. Dairy Low-fat (1%) or fat-free (skim) milk. Fat-free, low-fat, or reduced-fat cheeses. Nonfat, low-sodium ricotta or cottage cheese. Low-fat or nonfat yogurt. Low-fat, low-sodium cheese. Fats and oils Soft margarine without trans fats. Vegetable oil. Low-fat, reduced-fat, or light mayonnaise and salad dressings (reduced-sodium). Canola, safflower, olive, soybean, and sunflower oils. Avocado. Seasoning and other foods Herbs. Spices. Seasoning mixes without salt. Unsalted popcorn and pretzels. Fat-free sweets. What foods are not recommended? The items listed may not be a complete list. Talk with your dietitian about what dietary choices are best for you. Grains Baked goods made with fat, such as croissants, muffins, or some breads. Dry pasta or rice meal packs. Vegetables Creamed or fried vegetables. Vegetables in a cheese sauce. Regular canned vegetables (not low-sodium or reduced-sodium). Regular canned tomato sauce and paste (not low-sodium or reduced-sodium). Regular tomato and vegetable juice (not low-sodium or reduced-sodium). Pickles.  Olives. Fruits Canned fruit in a light or heavy syrup. Fried fruit. Fruit in cream or butter sauce. Meat and other protein foods Fatty cuts of meat. Ribs. Fried meat. Bacon. Sausage. Bologna and other processed lunch meats. Salami. Fatback. Hotdogs. Bratwurst. Salted nuts and seeds. Canned beans with added salt. Canned or smoked fish. Whole eggs or egg yolks. Chicken or turkey with skin. Dairy Whole or 2% milk, cream, and half-and-half. Whole or full-fat cream cheese. Whole-fat or sweetened yogurt. Full-fat cheese. Nondairy creamers. Whipped toppings. Processed cheese and cheese spreads. Fats and oils Butter. Stick margarine. Lard. Shortening. Ghee. Bacon fat. Tropical oils, such as coconut, palm kernel, or palm oil. Seasoning and other foods Salted popcorn and pretzels. Onion salt, garlic salt, seasoned salt, table salt, and sea salt. Worcestershire sauce. Tartar sauce. Barbecue sauce. Teriyaki sauce. Soy sauce, including reduced-sodium. Steak sauce. Canned and packaged gravies. Fish sauce. Oyster sauce. Cocktail sauce. Horseradish that you find on the shelf. Ketchup. Mustard. Meat flavorings and tenderizers. Bouillon cubes. Hot sauce and Tabasco sauce. Premade or packaged marinades. Premade or packaged taco seasonings. Relishes. Regular salad dressings. Where to find more information:  National Heart, Lung, and Blood Institute: www.nhlbi.nih.gov  American Heart Association: www.heart.org Summary  The DASH eating plan is a healthy eating plan that has been shown to reduce high blood pressure (hypertension). It may also reduce your risk for type 2 diabetes, heart disease, and stroke.  With the DASH eating plan, you should limit salt (sodium) intake to 2,300 mg a day. If you have hypertension, you may need to reduce your sodium intake to 1,500 mg a day.  When on the DASH eating plan, aim to eat more fresh fruits and vegetables, whole grains, lean proteins, low-fat dairy, and heart-healthy  fats.  Work with your health care provider or diet and nutrition specialist (dietitian) to adjust your eating plan to your individual calorie needs. This information is not intended to replace advice given to you by your health care provider. Make sure you discuss any questions you have with your health care provider. Document Released: 12/23/2010 Document Revised: 12/28/2015 Document Reviewed: 12/28/2015 Elsevier Interactive Patient Education  2018 Elsevier Inc.  

## 2017-12-27 ENCOUNTER — Ambulatory Visit: Payer: 59

## 2018-02-01 ENCOUNTER — Other Ambulatory Visit: Payer: Self-pay

## 2018-02-01 ENCOUNTER — Emergency Department
Admission: EM | Admit: 2018-02-01 | Discharge: 2018-02-01 | Disposition: A | Discharge status: Home / Self Care | Payer: 59 | Source: Home / Self Care | Attending: Family Medicine | Admitting: Family Medicine

## 2018-02-01 ENCOUNTER — Encounter: Payer: Self-pay | Admitting: *Deleted

## 2018-02-01 DIAGNOSIS — H01002 Unspecified blepharitis right lower eyelid: Secondary | ICD-10-CM | POA: Diagnosis not present

## 2018-02-01 MED ORDER — ERYTHROMYCIN 5 MG/GM OP OINT: TOPICAL_OINTMENT | OPHTHALMIC | 0 refills | Status: DC

## 2018-02-01 NOTE — ED Provider Notes (Signed)
[name]    CSN: [id number] Arrival date & time: 02/01/18  1194     History   Chief Complaint Chief Complaint  Patient presents with  . Eye Problem    HPI Micheal Brewer is a 43 y.o. male.   HPI  Micheal Brewer is a 43 y.o. male presenting to UC with c/o 2-3 days of worsening Right lower eyelid redness and soreness. He has had a stye on his Right lower eyelid for about 1 month. He is being seen by Dr. Lajoyce Corners, ophthalmology, who recommended he do facial scrubs and use tea tree oil. This has helped the stye but he changed his soaps recently and wonders if that has caused his Right lower eyelid to become inflamed and painful. Denies vision change. No foreign body sensation in his eye. No URI symptoms.    History reviewed. No pertinent past medical history.  Patient Active Problem List   Diagnosis Date Noted  . Closed nondisplaced fracture of body of right hamate bone 03/11/2013    Past Surgical History:  Procedure Laterality Date  . NO PAST SURGERIES         Home Medications    Prior to Admission medications   Medication Sig Start Date End Date Taking? Authorizing Provider  erythromycin ophthalmic ointment Place a 1/2 inch ribbon of ointment into the lower eyelid 3 times daily for 5 days. 02/01/18   Noe Gens, PA-C    Family History Family History  Problem Relation Age of Onset  . Heart failure Mother   . Hypertension Brother   . Hypertension Father     Social History Social History   Tobacco Use  . Smoking status: Never Smoker  . Smokeless tobacco: Never Used  Substance Use Topics  . Alcohol use: Yes    Alcohol/week: 6.0 - 8.0 standard drinks    Types: 6 - 8 Standard drinks or equivalent per week  . Drug use: No     Allergies   Penicillins   Review of Systems Review of Systems  HENT: Negative for congestion and rhinorrhea.   Eyes: Negative for photophobia, discharge, itching and visual disturbance.       Right lower eyelid       Physical Exam Triage Vital Signs ED Triage Vitals  Enc Vitals Group     BP      Pulse      Resp      Temp      Temp src      SpO2      Weight      Height      Head Circumference      Peak Flow      Pain Score      Pain Loc      Pain Edu?      Excl. in [name]?    No data found.  Updated Vital Signs BP (!) 154/82 (BP Location: Right Arm)   Pulse (!) 58   Temp 97.6 F (36.4 C) (Oral)   Resp 16   Ht 6\' 1"  (1.854 m)   Wt 179 lb (81.2 kg)   SpO2 100%   BMI 23.62 kg/m   Visual Acuity Right Eye Distance: 20/20 Left Eye Distance: 20/20 Bilateral Distance: 20/20(w/o correction)  Right Eye Near:   Left Eye Near:    Bilateral Near:     Physical Exam Vitals signs and nursing note reviewed.  Constitutional:      Appearance: Normal appearance. He is well-developed.  HENT:     Head: Normocephalic and atraumatic.     Nose: Nose normal.     Mouth/Throat:     Mouth: Mucous membranes are moist.  Eyes:     General:        Right eye: Hordeolum present. No discharge.     Extraocular Movements: Extraocular movements intact.     Conjunctiva/sclera: Conjunctivae normal.     Pupils: Pupils are equal, round, and reactive to light.      Comments: Right lower eyelid: erythema, mild edema, tender. No drainage.   Neck:     Musculoskeletal: Normal range of motion.  Cardiovascular:     Rate and Rhythm: Normal rate.  Pulmonary:     Effort: Pulmonary effort is normal.  Skin:    General: Skin is warm and dry.  Neurological:     Mental Status: He is alert.  Psychiatric:        Behavior: Behavior normal.      UC Treatments / Results  Labs (all labs ordered are listed, but only abnormal results are displayed) Labs Reviewed - No data to display  EKG None  Radiology No results found.  Procedures Procedures (including critical care time)  Medications Ordered in UC Medications - No data to display  Initial Impression / Assessment and Plan / UC Course  I have  reviewed the triage vital signs and the nursing notes.  Pertinent labs & imaging results that were available during my care of the patient were reviewed by me and considered in my medical decision making (see chart for details).     Hx and exam c/w blepharitis Will tx with erythromycin ointment   Final Clinical Impressions(s) / UC Diagnoses   Final diagnoses:  Blepharitis of right lower eyelid, unspecified type     Discharge Instructions      Please use the antibiotic ointment as prescribed. Please follow up with eye specialist in 5-7 days if not improving, sooner if worsening.    ED Prescriptions    Medication Sig Dispense Auth. Provider   erythromycin ophthalmic ointment Place a 1/2 inch ribbon of ointment into the lower eyelid 3 times daily for 5 days. 3.5 g Noe Gens, PA-C     Controlled Substance Prescriptions [place] Controlled Substance Registry consulted? Not Applicable   Tyrell Antonio 02/01/18 1018

## 2018-02-01 NOTE — Discharge Instructions (Signed)
°  Please use the antibiotic ointment as prescribed. Please follow up with eye specialist in 5-7 days if not improving, sooner if worsening.

## 2018-02-01 NOTE — ED Triage Notes (Signed)
Pt c/o pressure behind his RT eye and redness x 4 days. deneis injury.

## 2018-02-02 ENCOUNTER — Ambulatory Visit: Payer: 59

## 2018-02-05 ENCOUNTER — Emergency Department
Admission: EM | Admit: 2018-02-05 | Discharge: 2018-02-05 | Disposition: A | Discharge status: Home / Self Care | Payer: 59 | Source: Home / Self Care | Attending: Family Medicine | Admitting: Family Medicine

## 2018-02-05 ENCOUNTER — Encounter: Payer: Self-pay | Admitting: Emergency Medicine

## 2018-02-05 DIAGNOSIS — S0501XA Injury of conjunctiva and corneal abrasion without foreign body, right eye, initial encounter: Secondary | ICD-10-CM | POA: Diagnosis not present

## 2018-02-05 MED ORDER — GENTAMICIN SULFATE 0.3 % OP SOLN: 1.0000 [drp] | OPHTHALMIC | 0 refills | Status: DC

## 2018-02-05 NOTE — ED Triage Notes (Signed)
Pt states he was seen Thursday and given erythromycin drops for right eye pain. States he thought it was getting better but worsened yesterday. C/o pain, blurry vision and light sensitivity.

## 2018-02-05 NOTE — ED Provider Notes (Signed)
[name]    CSN: [id number] Arrival date & time: 02/05/18  1124     History   Chief Complaint Chief Complaint  Patient presents with  . Eye Pain    HPI Micheal Brewer is a 43 y.o. male.   HPI  Micheal Brewer is a 43 y.o. male presenting to UC with c/o Right eye pain and redness that started yesterday. He was seen at [facility] on Thursday, 02/01/2018 and dx with Right lower eyelid blepharitis.  Rx: erythromycin ointment. Symptoms were improving until yesterday.  C/o photophobia today and slight blurred vision due to watery eye. No known trauma. He does see Dr. Lajoyce Corners, ophthalmology but the office is closed for [place] [facility] today.   History reviewed. No pertinent past medical history.  Patient Active Problem List   Diagnosis Date Noted  . Closed nondisplaced fracture of body of right hamate bone 03/11/2013    Past Surgical History:  Procedure Laterality Date  . NO PAST SURGERIES         Home Medications    Prior to Admission medications   Medication Sig Start Date End Date Taking? Authorizing Provider  gentamicin (GARAMYCIN) 0.3 % ophthalmic solution Place 1 drop into the right eye every 4 (four) hours. For 5 days 02/05/18   Tyrell Antonio    Family History Family History  Problem Relation Age of Onset  . Heart failure Mother   . Hypertension Brother   . Hypertension Father     Social History Social History   Tobacco Use  . Smoking status: Never Smoker  . Smokeless tobacco: Never Used  Substance Use Topics  . Alcohol use: Yes    Alcohol/week: 6.0 - 8.0 standard drinks    Types: 6 - 8 Standard drinks or equivalent per week  . Drug use: No     Allergies   Penicillins   Review of Systems Review of Systems  HENT: Negative for congestion, ear pain and facial swelling.   Eyes: Positive for photophobia, pain, discharge (watery), redness and visual disturbance. Negative for itching.     Physical Exam Triage Vital Signs ED Triage Vitals    Enc Vitals Group     BP 02/05/18 1152 (!) 156/92     Pulse Rate 02/05/18 1152 76     Resp --      Temp 02/05/18 1152 98 F (36.7 C)     Temp Source 02/05/18 1152 Oral     SpO2 02/05/18 1152 98 %     Weight 02/05/18 1153 178 lb (80.7 kg)     Height --      Head Circumference --      Peak Flow --      Pain Score 02/05/18 1152 7     Pain Loc --      Pain Edu? --      Excl. in [place]? --    No data found.  Updated Vital Signs BP (!) 156/92 (BP Location: Right Arm)   Pulse 76   Temp 98 F (36.7 C) (Oral)   Wt 178 lb (80.7 kg)   SpO2 98%   BMI 23.48 kg/m   Visual Acuity Right Eye Distance: 20/25 Left Eye Distance: 20/25 Bilateral Distance:    Right Eye Near:   Left Eye Near:    Bilateral Near:     Physical Exam Vitals signs and nursing note reviewed.  Constitutional:      Appearance: Normal appearance. He is well-developed.  HENT:  Head: Normocephalic and atraumatic.  Eyes:     General:        Right eye: No foreign body or discharge.     Conjunctiva/sclera:     Right eye: Right conjunctiva is injected.     Pupils: Pupils are equal, round, and reactive to light.     Comments: Right eye: injected, faint erythema to lower eyelid. Fluorescein uptake just inferior to pupil c/w corneal abrasion. No foreign body noted.   Neck:     Musculoskeletal: Normal range of motion.  Cardiovascular:     Rate and Rhythm: Normal rate.  Pulmonary:     Effort: Pulmonary effort is normal.  Musculoskeletal: Normal range of motion.  Skin:    General: Skin is warm and dry.  Neurological:     Mental Status: He is alert and oriented to person, place, and time.  Psychiatric:        Behavior: Behavior normal.      UC Treatments / Results  Labs (all labs ordered are listed, but only abnormal results are displayed) Labs Reviewed - No data to display  EKG None  Radiology No results found.  Procedures Procedures (including critical care time)  Medications Ordered in  UC Medications - No data to display  Initial Impression / Assessment and Plan / UC Course  I have reviewed the triage vital signs and the nursing notes.  Pertinent labs & imaging results that were available during my care of the patient were reviewed by me and considered in my medical decision making (see chart for details).     Exam c/w corneal abrasion Will change pt from erythromycin ointment to gentamicin ophthalmic drops.  Final Clinical Impressions(s) / UC Diagnoses   Final diagnoses:  Abrasion of right cornea, initial encounter     Discharge Instructions      Please stop using the eye ointment and start using the prescription eye drops every 4 hours in your Right eye.  You do not need to wake yourself at night to apply drops.  Please call your eye specialist in the morning for further evaluation and treatment of symptoms.  Let them know that you were initially started on erythromycin eye ointment and were changed to gentamicin eye drops today.      ED Prescriptions    Medication Sig Dispense Auth. Provider   gentamicin (GARAMYCIN) 0.3 % ophthalmic solution Place 1 drop into the right eye every 4 (four) hours. For 5 days 5 mL Noe Gens, PA-C     Controlled Substance Prescriptions [place] Controlled Substance Registry consulted? Not Applicable   Tyrell Antonio 02/05/18 1323

## 2018-02-05 NOTE — Discharge Instructions (Signed)
°  Please stop using the eye ointment and start using the prescription eye drops every 4 hours in your Right eye.  You do not need to wake yourself at night to apply drops.  Please call your eye specialist in the morning for further evaluation and treatment of symptoms.  Let them know that you were initially started on erythromycin eye ointment and were changed to gentamicin eye drops today.

## 2018-02-08 ENCOUNTER — Ambulatory Visit (INDEPENDENT_AMBULATORY_CARE_PROVIDER_SITE_OTHER): Payer: 59 | Admitting: Family Medicine

## 2018-02-08 VITALS — BP 129/66 | HR 61

## 2018-02-08 DIAGNOSIS — R030 Elevated blood-pressure reading, without diagnosis of hypertension: Secondary | ICD-10-CM

## 2018-02-08 NOTE — Progress Notes (Signed)
Pt came into office for BP check. He is not on an BP Rx's but was elevated at last OV. States PCP questioned if he had white coat hypertension. BP today at goal. Advised if he is concerned for possible white coat to get a home BP cuff and start monitoring his home readings. Verbalized understanding. No further questions.

## 2018-02-08 NOTE — Progress Notes (Signed)
Agree with documentation as above.   Catherine Metheney, MD  

## 2018-12-16 ENCOUNTER — Emergency Department
Admission: EM | Admit: 2018-12-16 | Discharge: 2018-12-16 | Disposition: A | Discharge status: Home / Self Care | Payer: 59 | Source: Home / Self Care | Attending: Family Medicine | Admitting: Family Medicine

## 2018-12-16 ENCOUNTER — Other Ambulatory Visit: Payer: Self-pay

## 2018-12-16 ENCOUNTER — Encounter: Payer: Self-pay | Admitting: Emergency Medicine

## 2018-12-16 DIAGNOSIS — H15101 Unspecified episcleritis, right eye: Secondary | ICD-10-CM | POA: Diagnosis not present

## 2018-12-16 MED ORDER — SULFACETAMIDE-PREDNISOLONE 10-0.2 % OP SUSP: OPHTHALMIC | 0 refills | Status: DC

## 2018-12-16 NOTE — Discharge Instructions (Addendum)
Continue treatment for blepharitis as directed, including warm compresses. If symptoms become significantly worse during the night or over the weekend, proceed to the local emergency room.

## 2018-12-16 NOTE — ED Provider Notes (Signed)
[facility]    CSN: [id number] Arrival date & time: 12/16/18  1121      History   Chief Complaint Chief Complaint  Patient presents with  . Eye Problem    HPI Micheal Brewer is a 43 y.o. male.   Patient developed redness in his right eye 3 days ago with increased mucoid discharge, mild discomfort, and photophobia.  He developed increased lacrimation last night.  He denies changes in vision and foreign body sensation.  He is followed by an ophthalmologist for blepharitis.  The history is provided by the patient.  Eye Problem Location:  Right eye Quality:  Aching Severity:  Mild Onset quality:  Sudden Duration:  3 days Timing:  Constant Progression:  Unchanged Chronicity:  New Context: not burn, not chemical exposure, not contact lens problem, not direct trauma, not foreign body, not scratch, not smoke exposure and not UV exposure   Relieved by:  None tried Worsened by:  Bright light Ineffective treatments:  None tried Associated symptoms: crusting, discharge, inflammation, photophobia and redness   Associated symptoms: no blurred vision, no decreased vision, no double vision, no facial rash, no headaches, no itching, no scotomas, no swelling and no tearing     History reviewed. No pertinent past medical history.  Patient Active Problem List   Diagnosis Date Noted  . Closed nondisplaced fracture of body of right hamate bone 03/11/2013    Past Surgical History:  Procedure Laterality Date  . NO PAST SURGERIES         Home Medications    Prior to Admission medications   Medication Sig Start Date End Date Taking? Authorizing Provider  sulfacetamide-prednisolone [facility]) 10-0.2 % ophthalmic suspension Place one gtt in the right eye Q4hr while awake 12/16/18   Kandra Nicolas, MD    Family History Family History  Problem Relation Age of Onset  . Heart failure Mother   . Hypertension Brother   . Hypertension Father     Social History  Social History   Tobacco Use  . Smoking status: Never Smoker  . Smokeless tobacco: Never Used  Substance Use Topics  . Alcohol use: Yes    Alcohol/week: 6.0 - 8.0 standard drinks    Types: 6 - 8 Standard drinks or equivalent per week  . Drug use: No     Allergies   Penicillins   Review of Systems Review of Systems  Eyes: Positive for photophobia, discharge and redness. Negative for blurred vision, double vision and itching.  Neurological: Negative for headaches.  All other systems reviewed and are negative.    Physical Exam Triage Vital Signs ED Triage Vitals  Enc Vitals Group     BP 12/16/18 1304 (!) 148/91     Pulse Rate 12/16/18 1304 (!) 55     Resp --      Temp 12/16/18 1304 98.1 F (36.7 C)     Temp Source 12/16/18 1304 Oral     SpO2 12/16/18 1304 98 %     Weight 12/16/18 1304 175 lb (79.4 kg)     Height 12/16/18 1304 6' (1.829 m)     Head Circumference --      Peak Flow --      Pain Score 12/16/18 1307 3     Pain Loc --      Pain Edu? --      Excl. in [place]? --    No data found.  Updated Vital Signs BP (!) 148/91 (BP Location: Left Arm)  Pulse (!) 55   Temp 98.1 F (36.7 C) (Oral)   Ht 6' (1.829 m)   Wt 79.4 kg   SpO2 98%   BMI 23.73 kg/m   Visual Acuity Right Eye Distance: 20/20 Left Eye Distance: 20/20 Bilateral Distance: 20/20  Right Eye Near:   Left Eye Near:    Bilateral Near:     Physical Exam Vitals signs and nursing note reviewed.  Constitutional:      General: He is not in acute distress. HENT:     Head: Normocephalic.     Right Ear: Tympanic membrane normal.     Left Ear: Tympanic membrane normal.     Nose: Nose normal.     Mouth/Throat:     Pharynx: Oropharynx is clear.  Eyes:     General: Lids are normal. Vision grossly intact. Gaze aligned appropriately.        Right eye: No discharge or hordeolum.        Left eye: No discharge.     Extraocular Movements: Extraocular movements intact.     Pupils: Pupils are equal,  round, and reactive to light.      Comments: Right eye has bright red episcleral discoloration medially (and some laterally). Fundi benign.  Cardiovascular:     Rate and Rhythm: Normal rate.  Pulmonary:     Effort: Pulmonary effort is normal.  Lymphadenopathy:     Cervical: No cervical adenopathy.  Skin:    General: Skin is warm and dry.  Neurological:     Mental Status: He is alert.      UC Treatments / Results  Labs (all labs ordered are listed, but only abnormal results are displayed) Labs Reviewed - No data to display  EKG   Radiology No results found.  Procedures Procedures (including critical care time)  Medications Ordered in UC Medications - No data to display  Initial Impression / Assessment and Plan / UC Course  I have reviewed the triage vital signs and the nursing notes.  Pertinent labs & imaging results that were available during my care of the patient were reviewed by me and considered in my medical decision making (see chart for details).     Begin Blephamide gtts.  Followup with ophthalmologist in 3 to 4 days. Final Clinical Impressions(s) / UC Diagnoses   Final diagnoses:  Episcleritis of right eye     Discharge Instructions     Continue treatment for blepharitis as directed, including warm compresses. If symptoms become significantly worse during the night or over the weekend, proceed to the local emergency room.     ED Prescriptions    Medication Sig Dispense Auth. Provider   sulfacetamide-prednisolone [facility]) 10-0.2 % ophthalmic suspension Place one gtt in the right eye Q4hr while awake 5 mL Kandra Nicolas, MD        Kandra Nicolas, MD 12/19/18 [phone]

## 2018-12-16 NOTE — ED Triage Notes (Signed)
Patient c/o right eye redness, pain, drainage, sunlight irritation since Thursday, worse today.

## 2019-01-24 ENCOUNTER — Telehealth: Payer: Self-pay | Admitting: Family Medicine

## 2019-01-24 DIAGNOSIS — R030 Elevated blood-pressure reading, without diagnosis of hypertension: Secondary | ICD-10-CM

## 2019-01-24 DIAGNOSIS — Z0000 Encounter for general adult medical examination without abnormal findings: Secondary | ICD-10-CM

## 2019-01-24 NOTE — Telephone Encounter (Signed)
Patient called and is wanting to get labs before his appointment on Monday. I see some older labs that were not done from last year. They will need to be renewed. Please advise.

## 2019-01-25 NOTE — Telephone Encounter (Signed)
Labs ordered for patient. He did not have any questions.

## 2019-01-28 ENCOUNTER — Ambulatory Visit: Payer: 59 | Admitting: Family Medicine

## 2019-01-28 ENCOUNTER — Encounter: Payer: Self-pay | Admitting: Family Medicine

## 2019-01-28 ENCOUNTER — Encounter: Payer: Self-pay | Admitting: Gastroenterology

## 2019-01-28 ENCOUNTER — Other Ambulatory Visit: Payer: Self-pay

## 2019-01-28 VITALS — BP 121/83 | HR 59 | Ht 71.0 in | Wt 181.0 lb

## 2019-01-28 DIAGNOSIS — R1314 Dysphagia, pharyngoesophageal phase: Secondary | ICD-10-CM | POA: Diagnosis not present

## 2019-01-28 DIAGNOSIS — Z0000 Encounter for general adult medical examination without abnormal findings: Secondary | ICD-10-CM

## 2019-01-28 DIAGNOSIS — R1310 Dysphagia, unspecified: Secondary | ICD-10-CM | POA: Insufficient documentation

## 2019-01-28 DIAGNOSIS — R1313 Dysphagia, pharyngeal phase: Secondary | ICD-10-CM | POA: Insufficient documentation

## 2019-01-28 NOTE — Patient Instructions (Signed)
Preventive Care 41-44 Years Old, Male Preventive care refers to lifestyle choices and visits with your health care provider that can promote health and wellness. This includes:  A yearly physical exam. This is also called an annual well check.  Regular dental and eye exams.  Immunizations.  Screening for certain conditions.  Healthy lifestyle choices, such as eating a healthy diet, getting regular exercise, not using drugs or products that contain nicotine and tobacco, and limiting alcohol use. What can I expect for my preventive care visit? Physical exam Your health care provider will check:  Height and weight. These may be used to calculate body mass index (BMI), which is a measurement that tells if you are at a healthy weight.  Heart rate and blood pressure.  Your skin for abnormal spots. Counseling Your health care provider may ask you questions about:  Alcohol, tobacco, and drug use.  Emotional well-being.  Home and relationship well-being.  Sexual activity.  Eating habits.  Work and work Statistician. What immunizations do I need?  Influenza (flu) vaccine  This is recommended every year. Tetanus, diphtheria, and pertussis (Tdap) vaccine  You may need a Td booster every 10 years. Varicella (chickenpox) vaccine  You may need this vaccine if you have not already been vaccinated. Zoster (shingles) vaccine  You may need this after age 64. Measles, mumps, and rubella (MMR) vaccine  You may need at least one dose of MMR if you were born in 1957 or later. You may also need a second dose. Pneumococcal conjugate (PCV13) vaccine  You may need this if you have certain conditions and were not previously vaccinated. Pneumococcal polysaccharide (PPSV23) vaccine  You may need one or two doses if you smoke cigarettes or if you have certain conditions. Meningococcal conjugate (MenACWY) vaccine  You may need this if you have certain conditions. Hepatitis A  vaccine  You may need this if you have certain conditions or if you travel or work in places where you may be exposed to hepatitis A. Hepatitis B vaccine  You may need this if you have certain conditions or if you travel or work in places where you may be exposed to hepatitis B. Haemophilus influenzae type b (Hib) vaccine  You may need this if you have certain risk factors. Human papillomavirus (HPV) vaccine  If recommended by your health care provider, you may need three doses over 6 months. You may receive vaccines as individual doses or as more than one vaccine together in one shot (combination vaccines). Talk with your health care provider about the risks and benefits of combination vaccines. What tests do I need? Blood tests  Lipid and cholesterol levels. These may be checked every 5 years, or more frequently if you are over 60 years old.  Hepatitis C test.  Hepatitis B test. Screening  Lung cancer screening. You may have this screening every year starting at age 43 if you have a 30-pack-year history of smoking and currently smoke or have quit within the past 15 years.  Prostate cancer screening. Recommendations will vary depending on your family history and other risks.  Colorectal cancer screening. All adults should have this screening starting at age 72 and continuing until age 2. Your health care provider may recommend screening at age 14 if you are at increased risk. You will have tests every 1-10 years, depending on your results and the type of screening test.  Diabetes screening. This is done by checking your blood sugar (glucose) after you have not eaten  for a while (fasting). You may have this done every 1-3 years.  Sexually transmitted disease (STD) testing. Follow these instructions at home: Eating and drinking  Eat a diet that includes fresh fruits and vegetables, whole grains, lean protein, and low-fat dairy products.  Take vitamin and mineral supplements as  recommended by your health care provider.  Do not drink alcohol if your health care provider tells you not to drink.  If you drink alcohol: ? Limit how much you have to 0-2 drinks a day. ? Be aware of how much alcohol is in your drink. In the [place] one drink equals one 12 oz bottle of beer (355 mL), one 5 oz glass of wine (148 mL), or one 1 oz glass of hard liquor (44 mL). Lifestyle  Take daily care of your teeth and gums.  Stay active. Exercise for at least 30 minutes on 5 or more days each week.  Do not use any products that contain nicotine or tobacco, such as cigarettes, e-cigarettes, and chewing tobacco. If you need help quitting, ask your health care provider.  If you are sexually active, practice safe sex. Use a condom or other form of protection to prevent STIs (sexually transmitted infections).  Talk with your health care provider about taking a low-dose aspirin every day starting at age 53. What's next?  Go to your health care provider once a year for a well check visit.  Ask your health care provider how often you should have your eyes and teeth checked.  Stay up to date on all vaccines. This information is not intended to replace advice given to you by your health care provider. Make sure you discuss any questions you have with your health care provider. Document Revised: 12/28/2017 Document Reviewed: 12/28/2017 Elsevier Patient Education  2020 [employer].

## 2019-01-28 NOTE — Progress Notes (Signed)
CPE  Established Patient Office Visit  Subjective:  Patient ID: Micheal Brewer, male    DOB: 02-Mar-1975  Age: 44 y.o. MRN: [id number]  CC:  Chief Complaint  Patient presents with  . Annual Exam    HPI Micheal Brewer presents for CPE  He exercises regularly.  Walks for 30 to 45 minutes 3 days a week and then does circuit training 2 days a week.  He does have a small brown mole on his forehead that he would like me to look at today.  He also wanted to discuss that he has been having some problems with swallowing has been noticing that food occasionally gets stuck.  He says his father and grandfather have had problems with this in the past no history of esophageal or stomach cancers in the family.  He says it is really been going on for years but he has noticed that it is occurring more frequently.  It is occurring in the upper chest lower neck area.  History reviewed. No pertinent past medical history.  Past Surgical History:  Procedure Laterality Date  . NO PAST SURGERIES      Family History  Problem Relation Age of Onset  . Heart failure Mother   . Hypertension Brother   . Hypertension Father     Social History   Socioeconomic History  . Marital status: Married    Spouse name: Crystal  . Number of children: 1  . Years of education: Not on file  . Highest education level: Not on file  Occupational History  . Occupation: Curator: UNEMPLOYED  Tobacco Use  . Smoking status: Never Smoker  . Smokeless tobacco: Never Used  Substance and Sexual Activity  . Alcohol use: Yes    Alcohol/week: 6.0 - 8.0 standard drinks    Types: 6 - 8 Standard drinks or equivalent per week  . Drug use: No  . Sexual activity: Not on file  Other Topics Concern  . Not on file  Social History Narrative   Works out regularly.    Social Determinants of Health   Financial Resource Strain:   . Difficulty of Paying Living Expenses: Not on file  Food Insecurity:   .  Worried About Charity fundraiser in the Last Year: Not on file  . Ran Out of Food in the Last Year: Not on file  Transportation Needs:   . Lack of Transportation (Medical): Not on file  . Lack of Transportation (Non-Medical): Not on file  Physical Activity:   . Days of Exercise per Week: Not on file  . Minutes of Exercise per Session: Not on file  Stress:   . Feeling of Stress : Not on file  Social Connections:   . Frequency of Communication with Friends and Family: Not on file  . Frequency of Social Gatherings with Friends and Family: Not on file  . Attends Religious Services: Not on file  . Active Member of Clubs or Organizations: Not on file  . Attends Archivist Meetings: Not on file  . Marital Status: Not on file  Intimate Partner Violence:   . Fear of Current or Ex-Partner: Not on file  . Emotionally Abused: Not on file  . Physically Abused: Not on file  . Sexually Abused: Not on file    Outpatient Medications Prior to Visit  Medication Sig Dispense Refill  . doxycycline (VIBRAMYCIN) 50 MG capsule Take 50 mg by mouth daily.    Marland Kitchen  sulfacetamide-prednisolone (BLEPHAMIDE) 10-0.2 % ophthalmic suspension Place one gtt in the right eye Q4hr while awake 5 mL 0   No facility-administered medications prior to visit.    Allergies  Allergen Reactions  . Penicillins     REACTION: rash    ROS Review of Systems    Objective:    Physical Exam  Constitutional: He is oriented to person, place, and time. He appears well-developed and well-nourished.  HENT:  Head: Normocephalic and atraumatic.  Right Ear: External ear normal.  Left Ear: External ear normal.  Nose: Nose normal.  Mouth/Throat: Oropharynx is clear and moist.  Eyes: Pupils are equal, round, and reactive to light. Conjunctivae and EOM are normal.  Neck: No thyromegaly present.  Cardiovascular: Normal rate, regular rhythm, normal heart sounds and intact distal pulses.  Pulmonary/Chest: Effort normal and  breath sounds normal.  Abdominal: Soft. Bowel sounds are normal. He exhibits no distension and no mass. There is no abdominal tenderness. There is no rebound and no guarding.  Musculoskeletal:        General: Normal range of motion.     Cervical back: Normal range of motion and neck supple.  Lymphadenopathy:    He has no cervical adenopathy.  Neurological: He is alert and oriented to person, place, and time. He has normal reflexes.  Skin: Skin is warm and dry.  Psychiatric: He has a normal mood and affect. His behavior is normal. Judgment and thought content normal.    BP 121/83   Pulse (!) 59   Ht 5\' 11"  (1.803 m)   Wt 181 lb (82.1 kg)   SpO2 100%   BMI 25.24 kg/m  Wt Readings from Last 3 Encounters:  01/28/19 181 lb (82.1 kg)  12/16/18 175 lb (79.4 kg)  02/05/18 178 lb (80.7 kg)     Health Maintenance Due  Topic Date Due  . HIV Screening  08/21/1990    There are no preventive care reminders to display for this patient.  No results found for: TSH Lab Results  Component Value Date   WBC 6.1 11/24/2017   HGB 14.2 11/24/2017   HCT 40.1 11/24/2017   MCV 92.2 11/24/2017   PLT 246 11/24/2017   Lab Results  Component Value Date   NA 141 11/24/2017   K 3.9 11/24/2017   CO2 26 11/24/2017   GLUCOSE 91 11/24/2017   BUN 15 11/24/2017   CREATININE 1.28 11/24/2017   BILITOT 0.7 11/24/2017   ALKPHOS 62 10/03/2011   AST 18 11/24/2017   ALT 11 11/24/2017   PROT 6.9 11/24/2017   ALBUMIN 4.4 10/03/2011   CALCIUM 9.6 11/24/2017   Lab Results  Component Value Date   CHOL 191 11/24/2017   Lab Results  Component Value Date   HDL 45 11/24/2017   Lab Results  Component Value Date   LDLCALC 121 (H) 11/24/2017   Lab Results  Component Value Date   TRIG 131 11/24/2017   Lab Results  Component Value Date   CHOLHDL 4.2 11/24/2017   No results found for: HGBA1C    Assessment & Plan:   Problem List Items Addressed This Visit      Digestive   Dysphagia    Will  refer to GI for further w/u.  Consider possible esophageal structure.        Relevant Orders   Ambulatory referral to Gastroenterology    Other Visit Diagnoses    Wellness examination    -  Primary     Keep up a  regular exercise program and make sure you are eating a healthy diet Try to eat 4 servings of dairy a day, or if you are lactose intolerant take a calcium with vitamin D daily.  Your vaccines are up to date.     No orders of the defined types were placed in this encounter.   Follow-up: Return in about 1 year (around 01/28/2020) for Wellness Exam.    Beatrice Lecher, MD

## 2019-01-28 NOTE — Assessment & Plan Note (Signed)
Will refer to GI for further w/u.  Consider possible esophageal structure.

## 2019-01-29 LAB — CBC
HCT: 41.7 % (ref 38.5–50.0)
Hemoglobin: 14.8 g/dL (ref 13.2–17.1)
MCH: 34.2 pg — ABNORMAL HIGH (ref 27.0–33.0)
MCHC: 35.5 g/dL (ref 32.0–36.0)
MCV: 96.3 fL (ref 80.0–100.0)
MPV: 10.1 fL (ref 7.5–12.5)
Platelets: 225 10*3/uL (ref 140–400)
RBC: 4.33 10*6/uL (ref 4.20–5.80)
RDW: 12.3 % (ref 11.0–15.0)
WBC: 5 10*3/uL (ref 3.8–10.8)

## 2019-01-29 LAB — COMPLETE METABOLIC PANEL WITH GFR
AG Ratio: 1.7 (calc) (ref 1.0–2.5)
ALT: 11 U/L (ref 9–46)
AST: 18 U/L (ref 10–40)
Albumin: 4.3 g/dL (ref 3.6–5.1)
Alkaline phosphatase (APISO): 54 U/L (ref 36–130)
BUN: 16 mg/dL (ref 7–25)
CO2: 27 mmol/L (ref 20–32)
Calcium: 9.7 mg/dL (ref 8.6–10.3)
Chloride: 104 mmol/L (ref 98–110)
Creat: 1.11 mg/dL (ref 0.60–1.35)
GFR, Est African American: 94 mL/min/{1.73_m2} (ref >=60)
GFR, Est Non African American: 81 mL/min/{1.73_m2} (ref >=60)
Globulin: 2.5 g/dL (calc) (ref 1.9–3.7)
Glucose, Bld: 96 mg/dL (ref 65–99)
Potassium: 4 mmol/L (ref 3.5–5.3)
Sodium: 138 mmol/L (ref 135–146)
Total Bilirubin: 0.9 mg/dL (ref 0.2–1.2)
Total Protein: 6.8 g/dL (ref 6.1–8.1)

## 2019-01-29 LAB — LIPID PANEL W/REFLEX DIRECT LDL
Cholesterol: 188 mg/dL (ref <200)
HDL: 53 mg/dL (ref >=40)
LDL Cholesterol (Calc): 119 mg/dL (calc) — ABNORMAL HIGH
Non-HDL Cholesterol (Calc): 135 mg/dL (calc) — ABNORMAL HIGH (ref <130)
Total CHOL/HDL Ratio: 3.5 (calc) (ref <5.0)
Triglycerides: 66 mg/dL (ref <150)

## 2019-02-14 ENCOUNTER — Ambulatory Visit: Payer: 59 | Admitting: Gastroenterology

## 2019-02-14 ENCOUNTER — Encounter: Payer: Self-pay | Admitting: Gastroenterology

## 2019-02-14 ENCOUNTER — Other Ambulatory Visit: Payer: Self-pay

## 2019-02-14 VITALS — BP 120/76 | HR 66 | Temp 97.3°F | Ht 71.0 in | Wt 184.5 lb

## 2019-02-14 DIAGNOSIS — R1310 Dysphagia, unspecified: Secondary | ICD-10-CM | POA: Diagnosis not present

## 2019-02-14 DIAGNOSIS — Z01818 Encounter for other preprocedural examination: Secondary | ICD-10-CM

## 2019-02-14 NOTE — Patient Instructions (Signed)
You have been scheduled for an endoscopy. Please follow written instructions given to you at your visit today. If you use inhalers (even only as needed), please bring them with you on the day of your procedure. Your physician has requested that you go to www.startemmi.com and enter the access code given to you at your visit today. This web site gives a general overview about your procedure. However, you should still follow specific instructions given to you by our office regarding your preparation for the procedure.  It was a pleasure to see you today!  Vito Cirigliano, D.O.  

## 2019-02-14 NOTE — Progress Notes (Signed)
Chief Complaint: Dysphagia  Referring Provider:     Hali Marry, MD   HPI:    Micheal Brewer is a 44 y.o. male referred to the [facility]ology [facility] for evaluation of dysphagia. States that he has had solid food dysphagia for a number of years, but increasing in frequency and severity lately. Previously cleared with time/fluids, but now has to vomit food back out approx 75% of the time.  No food impactions requiring ER evaluation.  Points to mid sternum.  Rare episodes of HB. No prior medications for this.   Family history notable for father and grandfather with dysphagia as well; father also with reflux. Otherwise, no known family history of GI malignancy, IBD.  Reviewed labs from 01/28/2019: Normal CBC and CMP. No recent abdominal imaging for review. No previous EGD.   History reviewed. No pertinent past medical history.   Past Surgical History:  Procedure Laterality Date  . NO PAST SURGERIES     Family History  Problem Relation Age of Onset  . Heart failure Mother   . Hypertension Brother   . Hypertension Father   . Colon cancer Paternal Uncle        great uncle   . Esophageal cancer Neg Hx    Social History   Tobacco Use  . Smoking status: Never Smoker  . Smokeless tobacco: Never Used  Substance Use Topics  . Alcohol use: Yes    Alcohol/week: 6.0 - 8.0 standard drinks    Types: 6 - 8 Standard drinks or equivalent per week  . Drug use: No   Current Outpatient Medications  Medication Sig Dispense Refill  . doxycycline (VIBRAMYCIN) 50 MG capsule Take 50 mg by mouth daily.     No current facility-administered medications for this visit.   Allergies  Allergen Reactions  . Penicillins     REACTION: rash     Review of Systems: All systems reviewed and negative except where noted in HPI.     Physical Exam:    Wt Readings from Last 3 Encounters:  02/14/19 184 lb 8 oz (83.7 kg)  01/28/19 181 lb (82.1 kg)  12/16/18 175 lb (79.4  kg)    BP 120/76   Pulse 66   Temp (!) 97.3 F (36.3 C)   Ht 5\' 11"  (1.803 m)   Wt 184 lb 8 oz (83.7 kg)   BMI 25.73 kg/m  Constitutional:  Pleasant, in no acute distress. Psychiatric: Normal mood and affect. Behavior is normal. EENT: Pupils normal.  Conjunctivae are normal. No scleral icterus. Neck supple. No cervical LAD. Cardiovascular: Normal rate, regular rhythm. No edema Pulmonary/chest: Effort normal and breath sounds normal. No wheezing, rales or rhonchi. Abdominal: Soft, nondistended, nontender. Bowel sounds active throughout. There are no masses palpable. No hepatomegaly. Neurological: Alert and oriented to person place and time. Skin: Skin is warm and dry. No rashes noted.   ASSESSMENT AND PLAN;   1) Dysphagia: Discussed DDX for dysphagia, to include EOE, stricture, ring, motility disorder, etc. and proceed as follows:  - EGD with dilation and esophageal bxs -Holding off on introducing new medical therapy pending endoscopic findings -In the interim, cut food into small pieces, eat small bites, chew food thoroughly and with plenty of liquids to avoid food impaction.   The indications, risks, and benefits of EGD with dilation were explained to the patient in detail. Risks include but are not limited to bleeding, perforation, adverse reaction  to medications, and cardiopulmonary compromise.  Additional risks with esophageal dilation explained.  Sequelae include but are not limited to the possibility of surgery, hositalization, and mortality. The patient verbalized understanding and wished to proceed. All questions answered, referred to scheduler. Further recommendations pending results of the exam.      Lavena Bullion, DO, FACG  02/14/2019, 3:09 PM   Hali Marry, *

## 2019-02-19 ENCOUNTER — Encounter: Payer: Self-pay | Admitting: Gastroenterology

## 2019-02-26 ENCOUNTER — Ambulatory Visit (INDEPENDENT_AMBULATORY_CARE_PROVIDER_SITE_OTHER): Payer: 59

## 2019-02-26 ENCOUNTER — Other Ambulatory Visit: Payer: Self-pay | Admitting: Gastroenterology

## 2019-02-26 ENCOUNTER — Other Ambulatory Visit: Payer: Self-pay

## 2019-02-26 DIAGNOSIS — Z1159 Encounter for screening for other viral diseases: Secondary | ICD-10-CM

## 2019-02-27 LAB — SARS CORONAVIRUS 2 (TAT 6-24 HRS): SARS Coronavirus 2: NEGATIVE

## 2019-02-28 ENCOUNTER — Telehealth: Payer: Self-pay | Admitting: Gastroenterology

## 2019-02-28 NOTE — Telephone Encounter (Signed)
Called and spoke with patient-patient informed that he can still proceed with his procedure scheduled tomorrow as his COVID screening cam back neg.  Patient verbalized understanding of information/instructions;  Patient was advised to call the office at [phone] if questions/concerns arise;

## 2019-02-28 NOTE — Telephone Encounter (Signed)
Pt is scheduled for an EGD tomorrow at 10:00am. He stated that he just had Covid vaccine. He wants to know if he could still proceed with procedure.

## 2019-03-01 ENCOUNTER — Encounter: Payer: Self-pay | Admitting: Gastroenterology

## 2019-03-01 ENCOUNTER — Ambulatory Visit (AMBULATORY_SURGERY_CENTER): Payer: 59 | Admitting: Gastroenterology

## 2019-03-01 ENCOUNTER — Other Ambulatory Visit: Payer: Self-pay

## 2019-03-01 VITALS — BP 100/57 | HR 58 | Temp 97.3°F | Resp 21 | Ht 71.0 in | Wt 184.0 lb

## 2019-03-01 DIAGNOSIS — K2970 Gastritis, unspecified, without bleeding: Secondary | ICD-10-CM

## 2019-03-01 DIAGNOSIS — K317 Polyp of stomach and duodenum: Secondary | ICD-10-CM

## 2019-03-01 DIAGNOSIS — K200 Eosinophilic esophagitis: Secondary | ICD-10-CM

## 2019-03-01 DIAGNOSIS — R1310 Dysphagia, unspecified: Secondary | ICD-10-CM | POA: Diagnosis not present

## 2019-03-01 DIAGNOSIS — K3189 Other diseases of stomach and duodenum: Secondary | ICD-10-CM

## 2019-03-01 DIAGNOSIS — R12 Heartburn: Secondary | ICD-10-CM

## 2019-03-01 DIAGNOSIS — K2950 Unspecified chronic gastritis without bleeding: Secondary | ICD-10-CM

## 2019-03-01 MED ORDER — PANTOPRAZOLE SODIUM 40 MG PO TBEC: 40.0000 mg | DELAYED_RELEASE_TABLET | Freq: Two times a day (BID) | ORAL | 3 refills | Status: DC

## 2019-03-01 MED ORDER — SODIUM CHLORIDE 0.9 % IV SOLN: 500.0000 mL | Freq: Once | INTRAVENOUS | Status: DC

## 2019-03-01 NOTE — Progress Notes (Signed)
Report to PACU, RN, vss, BBS= Clear.  

## 2019-03-01 NOTE — Progress Notes (Signed)
Temp  LC  VS  DT  Pt's states no medical or surgical changes since previsit or office visit.    

## 2019-03-01 NOTE — Op Note (Signed)
[facility] Patient Name: Micheal Brewer Procedure Date: 03/01/2019 9:42 AM MRN: [id number] Endoscopist: [name] , MD Age: 44 Referring MD:  Date of Birth: 1975/06/30 Gender: Male Account #: [id number] Procedure:                Upper GI endoscopy Indications:              Dysphagia, Heartburn                           44 yo male with progressive solid food dysphagia                            along with rare heartburn. Not currently taking any                            acid suppression therapy/antacids. Medicines:                Monitored Anesthesia Care Procedure:                Pre-Anesthesia Assessment:                           - Prior to the procedure, a History and Physical                            was performed, and patient medications and                            allergies were reviewed. The patient's tolerance of                            previous anesthesia was also reviewed. The risks                            and benefits of the procedure and the sedation                            options and risks were discussed with the patient.                            All questions were answered, and informed consent                            was obtained. Prior Anticoagulants: The patient has                            taken no previous anticoagulant or antiplatelet                            agents. ASA Grade Assessment: II - A patient with                            mild systemic disease. After reviewing the risks  and benefits, the patient was deemed in                            satisfactory condition to undergo the procedure.                           After obtaining informed consent, the endoscope was                            passed under direct vision. Throughout the                            procedure, the patient's blood pressure, pulse, and                            oxygen saturations were monitored continuously. The                             Endoscope was introduced through the mouth, and                            advanced to the second part of duodenum. The upper                            GI endoscopy was accomplished without difficulty.                            The patient tolerated the procedure well. Scope In: Scope Out: Findings:                 Mucosal changes including feline appearance and                            longitudinal furrows were found in the entire                            esophagus. The scope was withdrawn. Dilation was                            performed with a Maloney dilator with mild                            resistance at 64 Fr. The dilation site was examined                            following endoscope reinsertion and showed mild                            mucosal disruption in the distal esophagus,                            consistent with successful dilation. Biopsies were  then obtained from the proximal and distal                            esophagus with cold forceps for histology of                            suspected eosinophilic esophagitis. Estimated blood                            loss was minimal.                           The gastroesophageal flap valve was visualized                            endoscopically and classified as Hill Grade II                            (fold present, opens with respiration).                           Scattered mild inflammation characterized by                            erythema was found in the gastric body, at the                            incisura and in the gastric antrum. Biopsies were                            taken with a cold forceps for Helicobacter pylori                            testing. Estimated blood loss was minimal.                           Three 1 to 5 mm sessile polyps with no bleeding and                            no stigmata of recent bleeding were found in the                             gastric fundus and in the gastric body. These                            polyps were removed with a cold biopsy forceps.                            Resection and retrieval were complete. Estimated                            blood loss was minimal.  The duodenal bulb, first portion of the duodenum                            and second portion of the duodenum were normal. Complications:            No immediate complications. Estimated Blood Loss:     Estimated blood loss was minimal. Impression:               - Esophageal mucosal changes consistent with                            eosinophilic esophagitis. Dilated with 78 Fr                            Maloney dilation then biopsied.                           - Gastroesophageal flap valve classified as Hill                            Grade II (fold present, opens with respiration).                           - Gastritis. Biopsied.                           - Three gastric polyps. Resected and retrieved.                           - Normal duodenal bulb, first portion of the                            duodenum and second portion of the duodenum. Recommendation:           - Patient has a contact number available for                            emergencies. The signs and symptoms of potential                            delayed complications were discussed with the                            patient. Return to normal activities tomorrow.                            Written discharge instructions were provided to the                            patient.                           - Soft diet today and advance as tolerated tomorrow.                           - Continue present medications.                           -  Await pathology results.                           - Given high clinical and endoscopic suspicion for                            Eosinophilic Esophagitis, start Protonix                             (pantoprazole) 40 mg PO BID for therapeutic intent.                            Will plan for repeat EGD in 8 weeks with repeat                            biopsies to evaluate for histologic and endoscopic                            response to therapy. Resume high dose PPI until                            that repeat endoscopy. Depending on response to                            therapy will dictate dose and duration of ongoing                            therapy vs addition of swallowed fluticasone.                           - Repeat upper endoscopy in 8 weeks for retreatment                            and for surveillance.                           - Return to GI clinic in 4 weeks. [name], MD 03/01/2019 10:08:46 AM

## 2019-03-01 NOTE — Patient Instructions (Signed)
YOU HAD AN ENDOSCOPIC PROCEDURE TODAY AT THE [facility]:   Refer to the procedure report that was given to you for any specific questions about what was found during the examination.  If the procedure report does not answer your questions, please call your gastroenterologist to clarify.  If you requested that your care partner not be given the details of your procedure findings, then the procedure report has been included in a sealed envelope for you to review at your convenience later.  YOU SHOULD EXPECT: Some feelings of bloating in the abdomen. Passage of more gas than usual.  Walking can help get rid of the air that was put into your GI tract during the procedure and reduce the bloating. If you had a lower endoscopy (such as a colonoscopy or flexible sigmoidoscopy) you may notice spotting of blood in your stool or on the toilet paper. If you underwent a bowel prep for your procedure, you may not have a normal bowel movement for a few days.  Please Note:  You might notice some irritation and congestion in your nose or some drainage.  This is from the oxygen used during your procedure.  There is no need for concern and it should clear up in a day or so.  SYMPTOMS TO REPORT IMMEDIATELY:   Following upper endoscopy (EGD)  Vomiting of blood or coffee ground material  New chest pain or pain under the shoulder blades  Painful or persistently difficult swallowing  New shortness of breath  Fever of 100F or higher  Black, tarry-looking stools  For urgent or emergent issues, a gastroenterologist can be reached at any hour by calling [phone].   DIET:  We do recommend a small meal at first, but then you may proceed to your regular diet.  Drink plenty of fluids but you should avoid alcoholic beverages for 24 hours.  ACTIVITY:  You should plan to take it easy for the rest of today and you should NOT DRIVE or use heavy machinery until tomorrow (because of the sedation medicines used  during the test).    FOLLOW UP: Our staff will call the number listed on your records 48-72 hours following your procedure to check on you and address any questions or concerns that you may have regarding the information given to you following your procedure. If we do not reach you, we will leave a message.  We will attempt to reach you two times.  During this call, we will ask if you have developed any symptoms of COVID 19. If you develop any symptoms (ie: fever, flu-like symptoms, shortness of breath, cough etc.) before then, please call [phone].  If you test positive for Covid 19 in the 2 weeks post procedure, please call and report this information to us.    If any biopsies were taken you will be contacted by phone or by letter within the next 1-3 weeks.  Please call us at [phone] if you have not heard about the biopsies in 3 weeks.    SIGNATURES/CONFIDENTIALITY: You and/or your care partner have signed paperwork which will be entered into your electronic medical record.  These signatures attest to the fact that that the information above on your After Visit Summary has been reviewed and is understood.  Full responsibility of the confidentiality of this discharge information lies with you and/or your care-partner. 

## 2019-03-01 NOTE — Progress Notes (Signed)
Return to GI office in 4 weeks Repeat Upper GI in 8 weeks

## 2019-03-01 NOTE — Progress Notes (Signed)
Called to room to assist during endoscopic procedure.  Patient ID and intended procedure confirmed with present staff. Received instructions for my participation in the procedure from the performing physician.  

## 2019-03-05 ENCOUNTER — Telehealth: Payer: Self-pay

## 2019-03-05 NOTE — Telephone Encounter (Signed)
  Follow up Call-  Call back number 03/01/2019  Post procedure Call Back phone  # [phone]  Permission to leave phone message Yes  Some recent data might be hidden     Patient questions:  Do you have a fever, pain , or abdominal swelling? No. Pain Score  0 *  Have you tolerated food without any problems? Yes.    Have you been able to return to your normal activities? Yes.    Do you have any questions about your discharge instructions: Diet   No. Medications  No. Follow up visit  No.  Do you have questions or concerns about your Care? No.  Actions: * If pain score is 4 or above: 1. No action needed, pain <4.Have you developed a fever since your procedure? no  2.   Have you had an respiratory symptoms (SOB or cough) since your procedure? no  3.   Have you tested positive for COVID 19 since your procedure no  4.   Have you had any family members/close contacts diagnosed with the COVID 19 since your procedure?  no   If yes to any of these questions please route to Joylene John, RN and Alphonsa Gin, Therapist, sports.

## 2019-04-03 ENCOUNTER — Other Ambulatory Visit: Payer: Self-pay

## 2019-04-03 ENCOUNTER — Ambulatory Visit: Payer: 59 | Admitting: Gastroenterology

## 2019-04-03 ENCOUNTER — Encounter: Payer: Self-pay | Admitting: Gastroenterology

## 2019-04-03 VITALS — BP 118/82 | HR 70 | Temp 97.3°F | Ht 71.0 in | Wt 184.4 lb

## 2019-04-03 DIAGNOSIS — R1310 Dysphagia, unspecified: Secondary | ICD-10-CM

## 2019-04-03 DIAGNOSIS — K2970 Gastritis, unspecified, without bleeding: Secondary | ICD-10-CM

## 2019-04-03 DIAGNOSIS — K200 Eosinophilic esophagitis: Secondary | ICD-10-CM

## 2019-04-03 NOTE — Patient Instructions (Signed)
You have been scheduled for an endoscopy. Please follow written instructions given to you at your visit today. If you use inhalers (even only as needed), please bring them with you on the day of your procedure. Your physician has requested that you go to www.startemmi.com and enter the access code given to you at your visit today. This web site gives a general overview about your procedure. However, you should still follow specific instructions given to you by our office regarding your preparation for the procedure.  It was a pleasure to see you today!  Vito Cirigliano, D.O.  

## 2019-04-03 NOTE — Progress Notes (Signed)
P  Chief Complaint:    Dysphagia, procedure follow-up  GI History: 44 year old male initially seen in the GI clinic on 02/06/2019 for evaluation of solid food dysphagia.  Symptoms have been present for a number of years, but increasing in frequency/severity, vomiting/regurgitating food back out approximately 75% of the time.  Rare episodes of heartburn.  EGD 02/2019 notable for EOE.  Dilated with 54 French Maloney dilator and started on high-dose PPI with plan for repeat EGD in 8 weeks.  Family history notable for father and grandfather with dysphagia as well; father also with reflux. Otherwise, no known family history of GI malignancy, IBD.  Endoscopic History: -EGD (02/2019, Dr. Bryan Lemma): Feline appearance and longitudinal furrows (biopsies with elevated eosinophils: 30 distal/40 proximal per hpf).  64 French Maloney dilator with mucosal rent.  Mild non-H. pylori gastritis, benign gastric polyps.  Started on high-dose PPI   HPI:    Patient is a 44 y.o. male presenting to the Gastroenterology Clinic for follow-up.  Initially seen by me in 01/2019 as outlined above, with subsequent EGD last month demonstrated changes consistent with EOE, dilated with 54 French Maloney dilator and started high-dose PPI, with plan for repeat EGD in 8 weeks.  Today, states he feels much better.  Taking Protonix 40 mg bid as prescribed without any issues. Dysphagia has essentially resolved. No issues/complaints today.  Otherwise, no new labs or imaging for review today.   Review of systems:     No chest pain, no SOB, no fevers, no urinary sx   History reviewed. No pertinent past medical history.  Patient's surgical history, family medical history, social history, medications and allergies were all reviewed in Epic    Current Outpatient Medications  Medication Sig Dispense Refill  . doxycycline (VIBRAMYCIN) 50 MG capsule Take 50 mg by mouth daily.    . pantoprazole (PROTONIX) 40 MG tablet Take 1  tablet (40 mg total) by mouth 2 (two) times daily. 90 tablet 3   No current facility-administered medications for this visit.    Physical Exam:     BP 118/82   Pulse 70   Temp (!) 97.3 F (36.3 C)   Ht 5\' 11"  (1.803 m)   Wt 184 lb 6 oz (83.6 kg)   BMI 25.72 kg/m   GENERAL:  Pleasant male in NAD PSYCH: : Cooperative, normal affect SKIN:  turgor, no lesions seen Musculoskeletal:  Normal muscle tone, normal strength NEURO: Alert and oriented x 3, no focal neurologic deficits   IMPRESSION and PLAN:    1) Eosinophilic Esophagitis 2) Dysphagia Discussed pathophysiology of EOE at length today with plan for the following: -Resume Protonix 40 mg bid -Repeat EGD in mid April (8 weeks after first EGD) with plan for repeat dilation as appropriate, repeat esophageal biopsies -Discussed potential future treatment plans to include: One) reducing PPI to daily, 2) continued high-dose PPI, and 3) changing to swallowed steroid (given good clinical response, do not feel this is necessary in the near future). Plan dependent on repeat endoscopic/histologic evaluation -Continue cutting food into small pieces and chew thoroughly with plenty of fluids with meals  3) Non-H. pylori gastritis -Evaluate for appropriate mucosal healing with high-dose PPI at time of repeat EGD  The indications, risks, and benefits of EGD were explained to the patient in detail. Risks include but are not limited to bleeding, perforation, adverse reaction to medications, and cardiopulmonary compromise. Sequelae include but are not limited to the possibility of surgery, hositalization, and mortality. The patient verbalized  understanding and wished to proceed. All questions answered, referred to scheduler. Further recommendations pending results of the exam.            [place] ,DO, FACG 04/03/2019, 8:20 AM

## 2019-04-22 ENCOUNTER — Encounter: Payer: Self-pay | Admitting: Gastroenterology

## 2019-05-02 ENCOUNTER — Telehealth: Payer: Self-pay

## 2019-05-02 NOTE — Telephone Encounter (Signed)
Called patient to verify that he has received both Covid vaccine doses.  He confirmed he had his 2nd dose on 03/28/19 so it is not necessary to schedule a Covid test.

## 2019-05-06 ENCOUNTER — Other Ambulatory Visit: Payer: Self-pay

## 2019-05-06 ENCOUNTER — Ambulatory Visit (AMBULATORY_SURGERY_CENTER): Payer: 59 | Admitting: Gastroenterology

## 2019-05-06 ENCOUNTER — Encounter: Payer: Self-pay | Admitting: Gastroenterology

## 2019-05-06 VITALS — BP 106/67 | HR 62 | Temp 96.9°F | Resp 15

## 2019-05-06 DIAGNOSIS — K200 Eosinophilic esophagitis: Secondary | ICD-10-CM

## 2019-05-06 DIAGNOSIS — K317 Polyp of stomach and duodenum: Secondary | ICD-10-CM

## 2019-05-06 DIAGNOSIS — R1310 Dysphagia, unspecified: Secondary | ICD-10-CM | POA: Diagnosis not present

## 2019-05-06 DIAGNOSIS — K2100 Gastro-esophageal reflux disease with esophagitis, without bleeding: Secondary | ICD-10-CM | POA: Diagnosis not present

## 2019-05-06 DIAGNOSIS — K222 Esophageal obstruction: Secondary | ICD-10-CM | POA: Diagnosis not present

## 2019-05-06 DIAGNOSIS — K2950 Unspecified chronic gastritis without bleeding: Secondary | ICD-10-CM | POA: Diagnosis not present

## 2019-05-06 DIAGNOSIS — K449 Diaphragmatic hernia without obstruction or gangrene: Secondary | ICD-10-CM

## 2019-05-06 DIAGNOSIS — K3189 Other diseases of stomach and duodenum: Secondary | ICD-10-CM | POA: Diagnosis not present

## 2019-05-06 DIAGNOSIS — K208 Other esophagitis without bleeding: Secondary | ICD-10-CM | POA: Diagnosis not present

## 2019-05-06 MED ORDER — SODIUM CHLORIDE 0.9 % IV SOLN: 500.0000 mL | Freq: Once | INTRAVENOUS | Status: DC

## 2019-05-06 NOTE — Progress Notes (Signed)
Report to PACU, RN, vss, BBS= Clear.  

## 2019-05-06 NOTE — Progress Notes (Signed)
Called to room to assist during endoscopic procedure.  Patient ID and intended procedure confirmed with present staff. Received instructions for my participation in the procedure from the performing physician.  

## 2019-05-06 NOTE — Progress Notes (Signed)
Vitals DT Temp LC  Reviewed HX

## 2019-05-06 NOTE — Op Note (Signed)
[place] Patient Name: Micheal Brewer Procedure Date: 05/06/2019 9:47 AM MRN: [id number] Endoscopist: [name] , MD Age: 44 Referring MD:  Date of Birth: [birth year] Gender: Male Account #: [id number] Procedure:                Upper GI endoscopy Indications:              Dysphagia, Follow-up of eosinophilic esophagitis,                            Follow-up of gastritis                           44 yo male with EoE diagnosed on EGD in 02/2019                            (felinization, longitudinal furrows with 30                            eosinophils in distal and 40 in proximal per hpf),                            treated with 90 Fr Maloney dilation (with mucosal                            rent) and high dose PPI with clinical improvement.                            He presents today to assess for endoscopic and                            histologic improvement. Medicines:                Monitored Anesthesia Care Procedure:                Pre-Anesthesia Assessment:                           - Prior to the procedure, a History and Physical                            was performed, and patient medications and                            allergies were reviewed. The patient's tolerance of                            previous anesthesia was also reviewed. The risks                            and benefits of the procedure and the sedation                            options and risks were discussed with the patient.  All questions were answered, and informed consent                            was obtained. Prior Anticoagulants: The patient has                            taken no previous anticoagulant or antiplatelet                            agents. ASA Grade Assessment: II - A patient with                            mild systemic disease. After reviewing the risks                            and benefits, the patient was deemed in             satisfactory condition to undergo the procedure.                           After obtaining informed consent, the endoscope was                            passed under direct vision. Throughout the                            procedure, the patient's blood pressure, pulse, and                            oxygen saturations were monitored continuously. The                            Endoscope was introduced through the mouth, and                            advanced to the second part of duodenum. The upper                            GI endoscopy was accomplished without difficulty.                            The patient tolerated the procedure well. Scope In: Scope Out: Findings:                 Mucosal changes including feline appearance and                            longitudinal furrows were found in the entire                            esophagus. Biopsies were obtained from the proximal                            and distal esophagus with cold forceps for  histology and evaluation of eosinophilic                            esophagitis. Estimated blood loss was minimal.                           One benign-appearing, intrinsic mild stenosis was                            found in the lower third of the esophagus. The                            stenosis was traversed. A TTS dilator was passed                            through the scope. Dilation with an 18-19-20 mm                            balloon dilator was performed to 20 mm. The                            inflated balloon was dragged up through the                            esophagus to the proximal esophagus. The dilation                            site was examined and showed mild mucosal                            disruption consistent with successful dilation of                            the lower esophageal stricture. Estimated blood                            loss was minimal. This was then  biopsied with a                            cold forceps for further fracturing of the                            stricture. Estimated blood loss was minimal.                           A 1 cm sliding type hiatal hernia was present.                           A single 6 mm sessile polyp with no bleeding and no                            stigmata of recent bleeding was found in the  gastric antrum. The polyp was removed with a cold                            snare. Resection and retrieval were complete. The                            polypectomy site continued to ooze, and was treated                            with one hemostatic clip, successfully placed with                            cessation of bleeding. There was no bleeding at the                            end of the procedure. Estimated blood loss was                            minimal.                           A few small sessile polyps with no stigmata of                            recent bleeding were found in the gastric fundus                            and in the gastric body. These were previously                            sampled and consistent with benign fundic gland                            polyps. These were not resected. The previously                            noted gastritis has since healed.                           The duodenal bulb, first portion of the duodenum                            and second portion of the duodenum were normal.                            Biopsies were taken with a cold forceps for                            histology and evaluation for Eosinophilic                            Enteritis. Estimated blood loss was minimal. Complications:            No immediate complications.  Estimated Blood Loss:     Estimated blood loss was minimal. Impression:               - Esophageal mucosal changes consistent with                            eosinophilic esophagitis.  Biopsied.                           - Benign-appearing esophageal stenosis. Dilated.                            Biopsied.                           - 1 cm hiatal hernia.                           - A single gastric polyp. Resected and retrieved.                            Clip was placed.                           - A few gastric polyps.                           - Normal duodenal bulb, first portion of the                            duodenum and second portion of the duodenum.                            Biopsied. Recommendation:           - Patient has a contact number available for                            emergencies. The signs and symptoms of potential                            delayed complications were discussed with the                            patient. Return to normal activities tomorrow.                            Written discharge instructions were provided to the                            patient.                           - Resume previous diet.                           - Continue present medications.                           -  Await pathology results.                           - Repeat upper endoscopy PRN for retreatment.                           - Return to GI clinic in 6-8 weeks. [name], MD 05/06/2019 10:25:37 AM

## 2019-05-06 NOTE — Patient Instructions (Signed)
A clip was placed in your stomach to stop bleeding from a polyp that was removed.  You have a clip card to carry with you in your wallet.  The clip will fall out eventually.   Return to GI clinic in 6-8 weeks.  YOU HAD AN ENDOSCOPIC PROCEDURE TODAY AT [facility]:   Refer to the procedure report that was given to you for any specific questions about what was found during the examination.  If the procedure report does not answer your questions, please call your gastroenterologist to clarify.  If you requested that your care partner not be given the details of your procedure findings, then the procedure report has been included in a sealed envelope for you to review at your convenience later.  YOU SHOULD EXPECT: Some feelings of bloating in the abdomen. Passage of more gas than usual.  Walking can help get rid of the air that was put into your GI tract during the procedure and reduce the bloating. If you had a lower endoscopy (such as a colonoscopy or flexible sigmoidoscopy) you may notice spotting of blood in your stool or on the toilet paper. If you underwent a bowel prep for your procedure, you may not have a normal bowel movement for a few days.  Please Note:  You might notice some irritation and congestion in your nose or some drainage.  This is from the oxygen used during your procedure.  There is no need for concern and it should clear up in a day or so.  SYMPTOMS TO REPORT IMMEDIATELY:    Following upper endoscopy (EGD)  Vomiting of blood or coffee ground material  New chest pain or pain under the shoulder blades  Painful or persistently difficult swallowing  New shortness of breath  Fever of 100F or higher  Black, tarry-looking stools  For urgent or emergent issues, a gastroenterologist can be reached at any hour by calling [phone]. Do not use MyChart messaging for urgent concerns.    DIET:  We do recommend a small meal at first, but then you may proceed to  your regular diet.  Drink plenty of fluids but you should avoid alcoholic beverages for 24 hours.  ACTIVITY:  You should plan to take it easy for the rest of today and you should NOT DRIVE or use heavy machinery until tomorrow (because of the sedation medicines used during the test).    FOLLOW UP: Our staff will call the number listed on your records 48-72 hours following your procedure to check on you and address any questions or concerns that you may have regarding the information given to you following your procedure. If we do not reach you, we will leave a message.  We will attempt to reach you two times.  During this call, we will ask if you have developed any symptoms of COVID 19. If you develop any symptoms (ie: fever, flu-like symptoms, shortness of breath, cough etc.) before then, please call [phone].  If you test positive for Covid 19 in the 2 weeks post procedure, please call and report this information to [place].    If any biopsies were taken you will be contacted by phone or by letter within the next 1-3 weeks.  Please call us at [phone] if you have not heard about the biopsies in 3 weeks.    SIGNATURES/CONFIDENTIALITY: You and/or your care partner have signed paperwork which will be entered into your electronic medical record.  These signatures attest to the  fact that that the information above on your After Visit Summary has been reviewed and is understood.  Full responsibility of the confidentiality of this discharge information lies with you and/or your care-partner.

## 2019-05-08 ENCOUNTER — Telehealth: Payer: Self-pay | Admitting: *Deleted

## 2019-05-08 NOTE — Telephone Encounter (Signed)
  Follow up Call-  Call back number 05/06/2019 03/01/2019  Post procedure Call Back phone  # [phone]  Permission to leave phone message Yes Yes  Some recent data might be hidden     Patient questions:  Do you have a fever, pain , or abdominal swelling? No. Pain Score  0 *  Have you tolerated food without any problems? Yes.    Have you been able to return to your normal activities? Yes.    Do you have any questions about your discharge instructions: Diet   No. Medications  No. Follow up visit  No.  Do you have questions or concerns about your Care? No.  Actions: * If pain score is 4 or above: No action needed, pain <4.  1. Have you developed a fever since your procedure? no  2.   Have you had an respiratory symptoms (SOB or cough) since your procedure? no  3.   Have you tested positive for COVID 19 since your procedure no  4.   Have you had any family members/close contacts diagnosed with the COVID 19 since your procedure?  no   If yes to any of these questions please route to Joylene John, RN and Erenest Rasher, RN

## 2019-07-10 ENCOUNTER — Encounter: Payer: Self-pay | Admitting: Gastroenterology

## 2019-07-10 ENCOUNTER — Ambulatory Visit: Payer: 59 | Admitting: Gastroenterology

## 2019-07-10 VITALS — BP 116/74 | HR 64 | Temp 97.1°F | Ht 71.0 in | Wt 184.4 lb

## 2019-07-10 DIAGNOSIS — K200 Eosinophilic esophagitis: Secondary | ICD-10-CM | POA: Diagnosis not present

## 2019-07-10 MED ORDER — PANTOPRAZOLE SODIUM 20 MG PO TBEC: 20.0000 mg | DELAYED_RELEASE_TABLET | Freq: Two times a day (BID) | ORAL | 5 refills | Status: DC

## 2019-07-10 NOTE — Progress Notes (Signed)
P  Chief Complaint:    Eosinophilic Esophagitis, procedure follow-up, dysphagia  GI History: 44 year old male initially seen in the GI clinic on 02/06/2019 for evaluation of solid food dysphagia.  Symptoms have been present for a number of years, but increasing in frequency/severity, vomiting/regurgitating food back out approximately 75% of the time. Rare episodes of heartburn.  EGD 02/2019 notable for EOE.  Dilated with 54 French Maloney dilator and started on high-dose PPI with plan for repeat EGD in 8 weeks.  Family history notable for father and grandfather with dysphagia as well; father also with reflux. Otherwise, no known family history of GI malignancy, IBD.  Endoscopic History: -EGD (2/ 12/2019, Dr. Bryan Lemma): Feline appearance and longitudinal furrows (biopsies with elevated eosinophils: 30 distal/40 proximal per hpf).  28 French Maloney dilator with mucosal rent.  Mild non-H. pylori gastritis, benign gastric polyps.  Started on high-dose PPI -EGD (05/06/2019, Dr. Bryan Lemma): Felinization and longitudinal furrows (biopsies: <10 eos distal, 17 eos proximal esophagus), Mild lower esophageal stenosis dilated with 20 mm TTS balloon with mucosal rent, then fractured with cold forceps.  1 cm sliding HH, 6 mm antral hyperplastic polyp, normal duodenum with biopsies negative for eosinophilic enteritis  HPI:     Patient is a 44 y.o. male presenting to the Gastroenterology Clinic for follow-up.  Was last seen by me in the office on 04/03/2019.  At that time reports feeling much better after EGD with esophageal dilation (46 Fr Maloney) on 03/01/2019 and starting high-dose PPI.  Repeat EGD on 05/06/2019 as above.  Significant improvement in esophageal eosinophilia, consistent with PPI responsive EOE.  Today, he states he feels well.  No issues of dysphagia.  Feels back to his usual state of health.  No complaints today.  Tolerating all p.o. intake and tolerating medications without  issue.   Review of systems:     No chest pain, no SOB, no fevers, no urinary sx   History reviewed. No pertinent past medical history.  Patient's surgical history, family medical history, social history, medications and allergies were all reviewed in Epic    Current Outpatient Medications  Medication Sig Dispense Refill  . doxycycline (VIBRAMYCIN) 50 MG capsule Take 50 mg by mouth daily.    . pantoprazole (PROTONIX) 40 MG tablet Take 1 tablet (40 mg total) by mouth 2 (two) times daily. 90 tablet 3   No current facility-administered medications for this visit.    Physical Exam:     BP 116/74   Pulse 64   Temp (!) 97.1 F (36.2 C)   Ht 5\' 11"  (1.803 m)   Wt 184 lb 6 oz (83.6 kg)   BMI 25.72 kg/m   GENERAL:  Pleasant male in NAD PSYCH: : Cooperative, normal affect NEURO: Alert and oriented x 3, no focal neurologic deficits   IMPRESSION and PLAN:    1) Eosinophilic Esophagitis (PPI responsive) 2) Dysphagia  -Good clinical, endoscopic, and histologic response to high-dose PPI and esophageal dilations.  Per current guidelines, can reduce PPI to decrease some of the side effect profile, but continued long-term medical management appropriate -Decrease Protonix to 20 mg PO BID -Continue cutting food into small pieces and chew thoroughly with plenty of fluids with meals -RTC in 12 months, or sooner prn -Check BMP, micronutrient panel at follow-up given need for long-term PPI use  I spent 20 minutes of time, including independent review of results as outlined above, communicating results with the patient directly, face-to-face time with the patient, coordinating care, ordering  studies and medications as appropriate, and documentation.           [place] ,DO, FACG 07/10/2019, 8:22 AM

## 2019-07-10 NOTE — Patient Instructions (Signed)
We have sent the following medications to your pharmacy for you to pick up at your convenience:  It was a pleasure to see you today!  Vito Cirigliano, D.O.

## 2019-07-30 ENCOUNTER — Encounter: Payer: Self-pay | Admitting: Physician Assistant

## 2019-07-30 ENCOUNTER — Ambulatory Visit: Payer: 59 | Admitting: Physician Assistant

## 2019-07-30 VITALS — BP 130/82 | HR 64 | Temp 98.3°F | Ht 71.0 in | Wt 182.0 lb

## 2019-07-30 DIAGNOSIS — R519 Headache, unspecified: Secondary | ICD-10-CM | POA: Diagnosis not present

## 2019-07-30 DIAGNOSIS — H5702 Anisocoria: Secondary | ICD-10-CM | POA: Insufficient documentation

## 2019-07-30 DIAGNOSIS — R29818 Other symptoms and signs involving the nervous system: Secondary | ICD-10-CM

## 2019-07-30 DIAGNOSIS — G500 Trigeminal neuralgia: Secondary | ICD-10-CM | POA: Diagnosis not present

## 2019-07-30 HISTORY — DX: Trigeminal neuralgia: G50.0

## 2019-07-30 NOTE — Progress Notes (Signed)
   Subjective:    Patient ID: Micheal Brewer, male    DOB: 1975/05/12, 44 y.o.   MRN: [id number]  HPI  Pt is a 44 yo male who presents to the clinic with new sharp pains above left eye, into cheek and into jaw. He noticed Saturday, 4 days ago.They have improved with ibuprofen but not resolved. No history of migraines. No significant sinus pressure, ear pain or ST. No vision changes. No upper ext weakness. No speech changes.  No fever, chills, body aches.no headache, nausea or vomiting.  Ibuprofen does help but when it wears off he has to take again. Seems to get worse throughout the day. Never had anything like this before. No new medication changes. No recent dental work.   .. Active Ambulatory Problems    Diagnosis Date Noted  . Closed nondisplaced fracture of body of right hamate bone 03/11/2013  . Dysphagia 01/28/2019   Resolved Ambulatory Problems    Diagnosis Date Noted  . Acute pharyngitis 07/20/2007  . URI 11/08/2007  . SINUSITIS 10/23/2009  . NAUSEA 07/20/2007  . GROIN PAIN 12/07/2009  . Pharyngeal dysphagia 01/28/2019   No Additional Past Medical History      Review of Systems  All other systems reviewed and are negative.      Objective:   Physical Exam Vitals reviewed.  Constitutional:      Appearance: Normal appearance.  HENT:     Head: Normocephalic.     Comments: No tenderness over frontal, maxillary sinuses. No temple tenderness.     Right Ear: Tympanic membrane, ear canal and external ear normal.     Left Ear: Tympanic membrane, ear canal and external ear normal.     Nose: Nose normal. No congestion.     Mouth/Throat:     Mouth: Mucous membranes are moist.     Pharynx: No oropharyngeal exudate.  Eyes:     Conjunctiva/sclera: Conjunctivae normal.     Comments: Asymmetric pupils left smaller than right. Both reactive.   Cardiovascular:     Rate and Rhythm: Normal rate and regular rhythm.  Pulmonary:     Effort: Pulmonary effort is normal.     Breath  sounds: Normal breath sounds.  Neurological:     General: No focal deficit present.     Mental Status: He is alert and oriented to person, place, and time.     Cranial Nerves: No cranial nerve deficit.     Sensory: No sensory deficit.     Motor: No weakness.     Coordination: Coordination normal.     Gait: Gait normal.     Deep Tendon Reflexes: Reflexes normal.     Comments: No facial asymmetry   Psychiatric:        Mood and Affect: Mood normal.           Assessment & Plan:  Marland KitchenMarland KitchenCruzito was seen today for eye problem.  Diagnoses and all orders for this visit:  Facial pain  Trigeminal neuralgia pain  Pupil asymmetry   Unclear etiology of symptoms. He does have asymmetric pupils. No vision changes. Persistent symptoms for 4 days. Presenting like trigeminal neuralgia. Right now ibuprofen is helping. Continue to use this. Will get MRI to evaluate new neurological symptoms.

## 2019-07-30 NOTE — Patient Instructions (Signed)
Trigeminal Neuralgia  Trigeminal neuralgia is a nerve disorder that causes severe pain on one side of the face. The pain may last from a few seconds to several minutes. The pain is usually only on one side of the face. Symptoms may occur for days, weeks, or months and then go away for months or years. The pain may return and be worse than before. What are the causes? This condition is caused by damage or pressure to a nerve in the head that is called the trigeminal nerve. An attack can be triggered by:  Talking.  Chewing.  Putting on makeup.  Washing your face.  Shaving your face.  Brushing your teeth.  Touching your face. What increases the risk? You are more likely to develop this condition if you:  Are 50 years of age or older.  Are male. What are the signs or symptoms? The main symptom of this condition is severe pain in the:  Jaw.  Lips.  Eyes.  Nose.  Scalp.  Forehead.  Face. The pain may be:  Intense.  Stabbing.  Electric.  Shock-like. How is this diagnosed? This condition is diagnosed with a physical exam. A CT scan or an MRI may be done to rule out other conditions that can cause facial pain. How is this treated? This condition may be treated with:  Avoiding the things that trigger your symptoms.  Taking prescription medicines (anticonvulsants).  Having surgery. This may be done in severe cases if other medical treatment does not provide relief.  Having procedures such as ablation, thermal, or radiation therapy. It may take up to one month for treatment to start relieving the pain. Follow these instructions at home: Managing pain  Learn as much as you can about how to manage your pain. Ask your health care provider if a pain specialist would be helpful.  Consider talking with a mental health care provider (psychologist) about how to cope with the pain.  Consider joining a pain support group. General instructions  Take  over-the-counter and prescription medicines only as told by your health care provider.  Avoid the things that trigger your symptoms. It may help to: ? Chew on the unaffected side of your mouth. ? Avoid touching your face. ? Avoid blasts of hot or cold air.  Follow your treatment plan as told by your health care provider. This may include: ? Cognitive or behavioral therapy. ? Gentle, regular exercise. ? Meditation or yoga. ? Aromatherapy.  Keep all follow-up visits as told by your health care provider. You may need to be monitored closely to make sure treatment is working well for you. Where to find more information  Facial Pain Association: fpa-support.org Contact a health care provider if:  Your medicine is not helping your symptoms.  You have side effects from the medicine used for treatment.  You develop new, unexplained symptoms, such as: ? Double vision. ? Facial weakness. ? Facial numbness. ? Changes in hearing or balance.  You feel depressed. Get help right away if:  Your pain is severe and is not getting better.  You develop suicidal thoughts. If you ever feel like you may hurt yourself or others, or have thoughts about taking your own life, get help right away. You can go to your nearest emergency department or call:  Your local emergency services (911 in the [place]  A suicide crisis helpline, such as the National Suicide Prevention Lifeline at 1-800-273-8255. This is open 24 hours a day. Summary  Trigeminal neuralgia is a   nerve disorder that causes severe pain on one side of the face. The pain may last from a few seconds to several minutes.  This condition is caused by damage or pressure to a nerve in the head that is called the trigeminal nerve.  Treatment may include avoiding the things that trigger your symptoms, taking medicines, or having surgery or procedures. It may take up to one month for treatment to start relieving the pain.  Avoid the things that  trigger your symptoms.  Keep all follow-up visits as told by your health care provider. You may need to be monitored closely to make sure treatment is working well for you. This information is not intended to replace advice given to you by your health care provider. Make sure you discuss any questions you have with your health care provider. Document Revised: 11/20/2017 Document Reviewed: 11/20/2017 Elsevier Patient Education  2020 [employer] Inc.  

## 2019-07-30 NOTE — Progress Notes (Signed)
Left eye pain - around eyebrow and down to jaw on left side of face Started Saturday Sharp pains - ibuprofen helps but comes right back

## 2019-07-31 ENCOUNTER — Telehealth: Payer: Self-pay | Admitting: Neurology

## 2019-07-31 NOTE — Telephone Encounter (Signed)
Patient left vm stating he has follow up questions about his appt yesterday. [phone].

## 2019-08-01 NOTE — Telephone Encounter (Signed)
Patient had MR at [facility] and wants to know next steps.   Results:    1. Moderate mucosal thickening and secretions in the paranasal sinuses, nonspecific. Correlate clinically for any signs/symptoms of acute sinusitis.  2. Otherwise, normal MRI of the brain without contrast.  I did make patient aware this has not been reviewed by Luvenia Starch, but since she is out of the office today I did let him know it looks normal besides some sinusitis. Will let him know more information and next steps once this is reviewed by [facility]. Khaniyah Bezek - please advise.

## 2019-08-02 MED ORDER — DOXYCYCLINE HYCLATE 100 MG PO TABS: 100.0000 mg | ORAL_TABLET | Freq: Two times a day (BID) | ORAL | 0 refills | Status: DC

## 2019-08-02 MED ORDER — FLUTICASONE PROPIONATE 50 MCG/ACT NA SUSP: 2.0000 | Freq: Every day | NASAL | 6 refills | Status: DC

## 2019-08-02 NOTE — Telephone Encounter (Signed)
GREAT news. MRI looks fine except for sinusitis. Sent doxycycline for 10 days and flonase nasal spray. Let us know if symptoms worsen or change.

## 2019-08-05 NOTE — Telephone Encounter (Signed)
Patient made aware of results/recommendations. 

## 2019-08-25 ENCOUNTER — Other Ambulatory Visit: Payer: Self-pay | Admitting: Gastroenterology

## 2019-08-25 DIAGNOSIS — K317 Polyp of stomach and duodenum: Secondary | ICD-10-CM

## 2019-08-25 DIAGNOSIS — R12 Heartburn: Secondary | ICD-10-CM

## 2019-08-25 DIAGNOSIS — K2970 Gastritis, unspecified, without bleeding: Secondary | ICD-10-CM

## 2019-08-25 DIAGNOSIS — K200 Eosinophilic esophagitis: Secondary | ICD-10-CM

## 2019-08-25 DIAGNOSIS — R1310 Dysphagia, unspecified: Secondary | ICD-10-CM

## 2019-12-19 ENCOUNTER — Ambulatory Visit (INDEPENDENT_AMBULATORY_CARE_PROVIDER_SITE_OTHER): Payer: 59 | Admitting: Nurse Practitioner

## 2019-12-19 ENCOUNTER — Encounter: Payer: Self-pay | Admitting: Nurse Practitioner

## 2019-12-19 ENCOUNTER — Other Ambulatory Visit: Payer: Self-pay

## 2019-12-19 VITALS — BP 144/82 | HR 88 | Temp 98.6°F | Ht 71.0 in | Wt 184.1 lb

## 2019-12-19 DIAGNOSIS — Z23 Encounter for immunization: Secondary | ICD-10-CM | POA: Diagnosis not present

## 2019-12-19 DIAGNOSIS — I10 Essential (primary) hypertension: Secondary | ICD-10-CM | POA: Diagnosis not present

## 2019-12-19 HISTORY — DX: Essential (primary) hypertension: I10

## 2019-12-19 MED ORDER — LISINOPRIL 5 MG PO TABS: 5.0000 mg | ORAL_TABLET | Freq: Every day | ORAL | 0 refills | Status: DC

## 2019-12-19 NOTE — Assessment & Plan Note (Addendum)
Documented elevated blood pressure readings in medical setting and at home with no other significant symptoms noted.  Will plan to begin low dose lisinopril today with plan to slowly titrate medication up as needed. Given that he has had this resolve spontaneously in the past, plan to monitor blood pressure closely for hypotension.  Labs recently drawn at [facility] for chest pain- labs and imaging reports reviewed- will not repeat labs today.  Recommend increased hydration and adequate nightly rest.  Recommend daily blood pressure monitoring while relaxed- patient provided with BP Log.  Recommend DASH diet and regular physical activity.  Plan to follow-up in 4 weeks to check on BP readings at home. Encouraged patient to reach out sooner if low blood pressure readings or side effects of medication noted or if his blood pressure is not coming down with low dose medication.

## 2019-12-19 NOTE — Progress Notes (Signed)
Acute Office Visit  Subjective:    Patient ID: Micheal Brewer, male    DOB: Sep 20, 1975, 44 y.o.   MRN: [id number]  Chief Complaint  Patient presents with  . Elevated Blood Pressure    HPI  Micheal Brewer is a pleasant 44 year old male presenting today with concerns about his blood pressure. He reports at a recent health exam at work his blood pressure was elevated in the 180's/90's, which was unusual for him. He began checking his blood pressure at home and has typically seen readings in the 381'O systolic with the lowest recent readings in the 175'Z systolic.   He reports that he did have elevated blood pressure readings in the past, but these resolved over time. He states that he has been eating a little more unhealthy than normal due to activities with his children.   He denies feelings of increased stress or anxiety, chest pain, palpitations, headache, vision changes, dizziness, numbness, weakness, or extremity edema.   He does tell me that he recently experienced some tingling in his right arm and subsequent left sided pinpoint chest pain for which he was seen in the emergency room. This was ruled non-cardiac origin and all of the labs and tests were normal. He reports they believe that he experienced a pinched nerve from moving a heavy object resulting in the arm numbness and possible situational anxiety resulting in the chest pain. He reports these symptoms have resolved.   He tells me his father and younger brother both have hypertension and are on medication for this. His mother had a history of heart failure. No other cardiac history that he is aware of.   Past Medical History:  Diagnosis Date  . Primary hypertension 12/19/2019  . Trigeminal neuralgia pain 07/30/2019    Past Surgical History:  Procedure Laterality Date  . NO PAST SURGERIES      Family History  Problem Relation Age of Onset  . Heart failure Mother   . Hypertension Brother   . Hypertension Father   . Colon  polyps Father   . Colon cancer Paternal Uncle        great uncle   . Prostate cancer Paternal Grandfather   . Esophageal cancer Neg Hx   . Rectal cancer Neg Hx   . Stomach cancer Neg Hx     Social History   Socioeconomic History  . Marital status: Married    Spouse name: Crystal  . Number of children: 2  . Years of education: Not on file  . Highest education level: Not on file  Occupational History  . Occupation: Curator: UNEMPLOYED  Tobacco Use  . Smoking status: Never Smoker  . Smokeless tobacco: Never Used  Vaping Use  . Vaping Use: Never used  Substance and Sexual Activity  . Alcohol use: Yes    Alcohol/week: 6.0 - 8.0 standard drinks    Types: 6 - 8 Standard drinks or equivalent per week  . Drug use: No  . Sexual activity: Not on file  Other Topics Concern  . Not on file  Social History Narrative   Works out regularly.    Social Determinants of Health   Financial Resource Strain:   . Difficulty of Paying Living Expenses: Not on file  Food Insecurity:   . Worried About Charity fundraiser in the Last Year: Not on file  . Ran Out of Food in the Last Year: Not on file  Transportation Needs:   .  Lack of Transportation (Medical): Not on file  . Lack of Transportation (Non-Medical): Not on file  Physical Activity:   . Days of Exercise per Week: Not on file  . Minutes of Exercise per Session: Not on file  Stress:   . Feeling of Stress : Not on file  Social Connections:   . Frequency of Communication with Friends and Family: Not on file  . Frequency of Social Gatherings with Friends and Family: Not on file  . Attends Religious Services: Not on file  . Active Member of Clubs or Organizations: Not on file  . Attends Archivist Meetings: Not on file  . Marital Status: Not on file  Intimate Partner Violence:   . Fear of Current or Ex-Partner: Not on file  . Emotionally Abused: Not on file  . Physically Abused: Not on file  .  Sexually Abused: Not on file    Outpatient Medications Prior to Visit  Medication Sig Dispense Refill  . doxycycline (VIBRA-TABS) 100 MG tablet Take 1 tablet (100 mg total) by mouth 2 (two) times daily. For 10 days. 20 tablet 0  . fluticasone (FLONASE) 50 MCG/ACT nasal spray Place 2 sprays into both nostrils daily. 16 g 6  . pantoprazole (PROTONIX) 20 MG tablet Take 1 tablet (20 mg total) by mouth 2 (two) times daily. 180 tablet 5   No facility-administered medications prior to visit.    Allergies  Allergen Reactions  . Penicillins Rash    REACTION: rash    Review of Systems All review of systems negative except what is listed in the HPI     Objective:    Physical Exam Vitals and nursing note reviewed.  Constitutional:      General: He is not in acute distress.    Appearance: Normal appearance. He is normal weight. He is not ill-appearing.  HENT:     Head: Normocephalic.  Eyes:     Extraocular Movements: Extraocular movements intact.     Conjunctiva/sclera: Conjunctivae normal.     Pupils: Pupils are equal, round, and reactive to light.  Neck:     Vascular: No carotid bruit.  Cardiovascular:     Rate and Rhythm: Normal rate and regular rhythm.     Pulses: Normal pulses.     Heart sounds: No murmur heard.   Pulmonary:     Effort: Pulmonary effort is normal.     Breath sounds: Normal breath sounds.  Abdominal:     General: Abdomen is flat. Bowel sounds are normal. There is no distension.     Palpations: Abdomen is soft.  Musculoskeletal:        General: Normal range of motion.     Cervical back: Normal range of motion and neck supple. No rigidity or tenderness.     Right lower leg: No edema.     Left lower leg: No edema.  Skin:    General: Skin is warm and dry.     Capillary Refill: Capillary refill takes less than 2 seconds.  Neurological:     General: No focal deficit present.     Mental Status: He is alert and oriented to person, place, and time.   Psychiatric:        Mood and Affect: Mood normal.        Behavior: Behavior normal.        Thought Content: Thought content normal.        Judgment: Judgment normal.     BP (!) 144/82   Pulse  88   Temp 98.6 F (37 C) (Oral)   Ht 5\' 11"  (1.803 m)   Wt 184 lb 1.6 oz (83.5 kg)   SpO2 99%   BMI 25.68 kg/m  Wt Readings from Last 3 Encounters:  12/19/19 184 lb 1.6 oz (83.5 kg)  07/30/19 182 lb (82.6 kg)  07/10/19 184 lb 6 oz (83.6 kg)    There are no preventive care reminders to display for this patient.  There are no preventive care reminders to display for this patient.   No results found for: TSH Lab Results  Component Value Date   WBC 5.0 01/28/2019   HGB 14.8 01/28/2019   HCT 41.7 01/28/2019   MCV 96.3 01/28/2019   PLT 225 01/28/2019   Lab Results  Component Value Date   NA 138 01/28/2019   K 4.0 01/28/2019   CO2 27 01/28/2019   GLUCOSE 96 01/28/2019   BUN 16 01/28/2019   CREATININE 1.11 01/28/2019   BILITOT 0.9 01/28/2019   ALKPHOS 62 10/03/2011   AST 18 01/28/2019   ALT 11 01/28/2019   PROT 6.8 01/28/2019   ALBUMIN 4.4 10/03/2011   CALCIUM 9.7 01/28/2019   Lab Results  Component Value Date   CHOL 188 01/28/2019   Lab Results  Component Value Date   HDL 53 01/28/2019   Lab Results  Component Value Date   LDLCALC 119 (H) 01/28/2019   Lab Results  Component Value Date   TRIG 66 01/28/2019   Lab Results  Component Value Date   CHOLHDL 3.5 01/28/2019   No results found for: HGBA1C     Assessment & Plan:   Problem List Items Addressed This Visit      Cardiovascular and Mediastinum   Primary hypertension - Primary    Documented elevated blood pressure readings in medical setting and at home with no other significant symptoms noted.  Will plan to begin low dose lisinopril today with plan to slowly titrate medication up as needed. Given that he has had this resolve spontaneously in the past, plan to monitor blood pressure closely for  hypotension.  Labs recently drawn at [facility] for chest pain- labs and imaging reports reviewed- will not repeat labs today.  Recommend increased hydration and adequate nightly rest.  Recommend daily blood pressure monitoring while relaxed- patient provided with BP Log.  Recommend DASH diet and regular physical activity.  Plan to follow-up in 4 weeks to check on BP readings at home. Encouraged patient to reach out sooner if low blood pressure readings or side effects of medication noted or if his blood pressure is not coming down with low dose medication.        Relevant Medications   lisinopril (ZESTRIL) 5 MG tablet    Other Visit Diagnoses    Need for influenza vaccination       Relevant Orders   Flu Vaccine QUAD 36+ mos IM (Completed)        Meds ordered this encounter  Medications  . lisinopril (ZESTRIL) 5 MG tablet    Sig: Take 1 tablet (5 mg total) by mouth daily.    Dispense:  90 tablet    Refill:  0     Orma Render, NP

## 2019-12-19 NOTE — Patient Instructions (Addendum)
We want your goal average blood pressures to be below 120/80, but higher than 100/60. It may take some time to get the medication just right, but we can work together to get this under control.  General Tips for BP Control:  DASH Diet (lowers 8-59mmHg)   Smoking Cessation   Decrease alcohol consumption  (Men, </= 24 oz beer/ 10 oz wine/ 2 oz whiskey per day)   Weight loss (lowers 5-43mmHg per 10kg/22lbs lost)   Exercise>/= 30 minutes per day, at least 5 days/week   Decrease anxiety and stressors   Meditation/Relaxation/Massage   Hypertension, Adult High blood pressure (hypertension) is when the force of blood pumping through the arteries is too strong. The arteries are the blood vessels that carry blood from the heart throughout the body. Hypertension forces the heart to work harder to pump blood and may cause arteries to become narrow or stiff. Untreated or uncontrolled hypertension can cause a heart attack, heart failure, a stroke, kidney disease, and other problems. A blood pressure reading consists of a higher number over a lower number. Ideally, your blood pressure should be below 120/80. The first ("top") number is called the systolic pressure. It is a measure of the pressure in your arteries as your heart beats. The second ("bottom") number is called the diastolic pressure. It is a measure of the pressure in your arteries as the heart relaxes. What are the causes? The exact cause of this condition is not known. There are some conditions that result in or are related to high blood pressure. What increases the risk? Some risk factors for high blood pressure are under your control. The following factors may make you more likely to develop this condition:  Smoking.  Having type 2 diabetes mellitus, high cholesterol, or both.  Not getting enough exercise or physical activity.  Being overweight.  Having too much fat, sugar, calories, or salt (sodium) in your diet.  Drinking too  much alcohol. Some risk factors for high blood pressure may be difficult or impossible to change. Some of these factors include:  Having chronic kidney disease.  Having a family history of high blood pressure.  Age. Risk increases with age.  Race. You may be at higher risk if you are African American.  Gender. Men are at higher risk than women before age 22. After age 33, women are at higher risk than men.  Having obstructive sleep apnea.  Stress. What are the signs or symptoms? High blood pressure may not cause symptoms. Very high blood pressure (hypertensive crisis) may cause:  Headache.  Anxiety.  Shortness of breath.  Nosebleed.  Nausea and vomiting.  Vision changes.  Severe chest pain.  Seizures. How is this diagnosed? This condition is diagnosed by measuring your blood pressure while you are seated, with your arm resting on a flat surface, your legs uncrossed, and your feet flat on the floor. The cuff of the blood pressure monitor will be placed directly against the skin of your upper arm at the level of your heart. It should be measured at least twice using the same arm. Certain conditions can cause a difference in blood pressure between your right and left arms. Certain factors can cause blood pressure readings to be lower or higher than normal for a short period of time:  When your blood pressure is higher when you are in a health care provider's office than when you are at home, this is called white coat hypertension. Most people with this condition do not  need medicines.  When your blood pressure is higher at home than when you are in a health care provider's office, this is called masked hypertension. Most people with this condition may need medicines to control blood pressure. If you have a high blood pressure reading during one visit or you have normal blood pressure with other risk factors, you may be asked to:  Return on a different day to have your blood  pressure checked again.  Monitor your blood pressure at home for 1 week or longer. If you are diagnosed with hypertension, you may have other blood or imaging tests to help your health care provider understand your overall risk for other conditions. How is this treated? This condition is treated by making healthy lifestyle changes, such as eating healthy foods, exercising more, and reducing your alcohol intake. Your health care provider may prescribe medicine if lifestyle changes are not enough to get your blood pressure under control, and if:  Your systolic blood pressure is above 130.  Your diastolic blood pressure is above 80. Your personal target blood pressure may vary depending on your medical conditions, your age, and other factors. Follow these instructions at home: Eating and drinking   Eat a diet that is high in fiber and potassium, and low in sodium, added sugar, and fat. An example eating plan is called the DASH (Dietary Approaches to Stop Hypertension) diet. To eat this way: ? Eat plenty of fresh fruits and vegetables. Try to fill one half of your plate at each meal with fruits and vegetables. ? Eat whole grains, such as whole-wheat pasta, brown rice, or whole-grain bread. Fill about one fourth of your plate with whole grains. ? Eat or drink low-fat dairy products, such as skim milk or low-fat yogurt. ? Avoid fatty cuts of meat, processed or cured meats, and poultry with skin. Fill about one fourth of your plate with lean proteins, such as fish, chicken without skin, beans, eggs, or tofu. ? Avoid pre-made and processed foods. These tend to be higher in sodium, added sugar, and fat.  Reduce your daily sodium intake. Most people with hypertension should eat less than 1,500 mg of sodium a day.  Do not drink alcohol if: ? Your health care provider tells you not to drink. ? You are pregnant, may be pregnant, or are planning to become pregnant.  If you drink alcohol: ? Limit how  much you use to:  0-1 drink a day for women.  0-2 drinks a day for men. ? Be aware of how much alcohol is in your drink. In the [place] one drink equals one 12 oz bottle of beer (355 mL), one 5 oz glass of wine (148 mL), or one 1 oz glass of hard liquor (44 mL). Lifestyle   Work with your health care provider to maintain a healthy body weight or to lose weight. Ask what an ideal weight is for you.  Get at least 30 minutes of exercise most days of the week. Activities may include walking, swimming, or biking.  Include exercise to strengthen your muscles (resistance exercise), such as Pilates or lifting weights, as part of your weekly exercise routine. Try to do these types of exercises for 30 minutes at least 3 days a week.  Do not use any products that contain nicotine or tobacco, such as cigarettes, e-cigarettes, and chewing tobacco. If you need help quitting, ask your health care provider.  Monitor your blood pressure at home as told by your health care provider.  Keep all follow-up visits as told by your health care provider. This is important. Medicines  Take over-the-counter and prescription medicines only as told by your health care provider. Follow directions carefully. Blood pressure medicines must be taken as prescribed.  Do not skip doses of blood pressure medicine. Doing this puts you at risk for problems and can make the medicine less effective.  Ask your health care provider about side effects or reactions to medicines that you should watch for. Contact a health care provider if you:  Think you are having a reaction to a medicine you are taking.  Have headaches that keep coming back (recurring).  Feel dizzy.  Have swelling in your ankles.  Have trouble with your vision. Get help right away if you:  Develop a severe headache or confusion.  Have unusual weakness or numbness.  Feel faint.  Have severe pain in your chest or abdomen.  Vomit repeatedly.  Have  trouble breathing. Summary  Hypertension is when the force of blood pumping through your arteries is too strong. If this condition is not controlled, it may put you at risk for serious complications.  Your personal target blood pressure may vary depending on your medical conditions, your age, and other factors. For most people, a normal blood pressure is less than 120/80.  Hypertension is treated with lifestyle changes, medicines, or a combination of both. Lifestyle changes include losing weight, eating a healthy, low-sodium diet, exercising more, and limiting alcohol.   DASH Eating Plan DASH stands for "Dietary Approaches to Stop Hypertension." The DASH eating plan is a healthy eating plan that has been shown to reduce high blood pressure (hypertension). It may also reduce your risk for type 2 diabetes, heart disease, and stroke. The DASH eating plan may also help with weight loss. What are tips for following this plan?  General guidelines  Avoid eating more than 2,300 mg (milligrams) of salt (sodium) a day. If you have hypertension, you may need to reduce your sodium intake to 1,500 mg a day.  Limit alcohol intake to no more than 1 drink a day for nonpregnant women and 2 drinks a day for men. One drink equals 12 oz of beer, 5 oz of wine, or 1 oz of hard liquor.  Work with your health care provider to maintain a healthy body weight or to lose weight. Ask what an ideal weight is for you.  Get at least 30 minutes of exercise that causes your heart to beat faster (aerobic exercise) most days of the week. Activities may include walking, swimming, or biking.  Work with your health care provider or diet and nutrition specialist (dietitian) to adjust your eating plan to your individual calorie needs. Reading food labels   Check food labels for the amount of sodium per serving. Choose foods with less than 5 percent of the Daily Value of sodium. Generally, foods with less than 300 mg of sodium  per serving fit into this eating plan.  To find whole grains, look for the word "whole" as the first word in the ingredient list. Shopping  Buy products labeled as "low-sodium" or "no salt added."  Buy fresh foods. Avoid canned foods and premade or frozen meals. Cooking  Avoid adding salt when cooking. Use salt-free seasonings or herbs instead of table salt or sea salt. Check with your health care provider or pharmacist before using salt substitutes.  Do not fry foods. Cook foods using healthy methods such as baking, boiling, grilling, and broiling instead.  Cook with heart-healthy oils, such as olive, canola, soybean, or sunflower oil. Meal planning  Eat a balanced diet that includes: ? 5 or more servings of fruits and vegetables each day. At each meal, try to fill half of your plate with fruits and vegetables. ? Up to 6-8 servings of whole grains each day. ? Less than 6 oz of lean meat, poultry, or fish each day. A 3-oz serving of meat is about the same size as a deck of cards. One egg equals 1 oz. ? 2 servings of low-fat dairy each day. ? A serving of nuts, seeds, or beans 5 times each week. ? Heart-healthy fats. Healthy fats called Omega-3 fatty acids are found in foods such as flaxseeds and coldwater fish, like sardines, salmon, and mackerel.  Limit how much you eat of the following: ? Canned or prepackaged foods. ? Food that is high in trans fat, such as fried foods. ? Food that is high in saturated fat, such as fatty meat. ? Sweets, desserts, sugary drinks, and other foods with added sugar. ? Full-fat dairy products.  Do not salt foods before eating.  Try to eat at least 2 vegetarian meals each week.  Eat more home-cooked food and less restaurant, buffet, and fast food.  When eating at a restaurant, ask that your food be prepared with less salt or no salt, if possible. What foods are recommended? The items listed may not be a complete list. Talk with your dietitian  about what dietary choices are best for you. Grains Whole-grain or whole-wheat bread. Whole-grain or whole-wheat pasta. Brown rice. [place]. Bulgur. Whole-grain and low-sodium cereals. Pita bread. Low-fat, low-sodium crackers. Whole-wheat flour tortillas. Vegetables Fresh or frozen vegetables (raw, steamed, roasted, or grilled). Low-sodium or reduced-sodium tomato and vegetable juice. Low-sodium or reduced-sodium tomato sauce and tomato paste. Low-sodium or reduced-sodium canned vegetables. Fruits All fresh, dried, or frozen fruit. Canned fruit in natural juice (without added sugar). Meat and other protein foods Skinless chicken or [place]. Ground chicken or [place]. Pork with fat trimmed off. Fish and seafood. Egg whites. Dried beans, peas, or lentils. Unsalted nuts, nut butters, and seeds. Unsalted canned beans. Lean cuts of beef with fat trimmed off. Low-sodium, lean deli meat. Dairy Low-fat (1%) or fat-free (skim) milk. Fat-free, low-fat, or reduced-fat cheeses. Nonfat, low-sodium ricotta or cottage cheese. Low-fat or nonfat yogurt. Low-fat, low-sodium cheese. Fats and oils Soft margarine without trans fats. Vegetable oil. Low-fat, reduced-fat, or light mayonnaise and salad dressings (reduced-sodium). Canola, safflower, olive, soybean, and sunflower oils. Avocado. Seasoning and other foods Herbs. Spices. Seasoning mixes without salt. Unsalted popcorn and pretzels. Fat-free sweets. What foods are not recommended? The items listed may not be a complete list. Talk with your dietitian about what dietary choices are best for you. Grains Baked goods made with fat, such as croissants, muffins, or some breads. Dry pasta or rice meal packs. Vegetables Creamed or fried vegetables. Vegetables in a cheese sauce. Regular canned vegetables (not low-sodium or reduced-sodium). Regular canned tomato sauce and paste (not low-sodium or reduced-sodium). Regular tomato and vegetable juice (not low-sodium or  reduced-sodium). Angie Fava. Olives. Fruits Canned fruit in a light or heavy syrup. Fried fruit. Fruit in cream or butter sauce. Meat and other protein foods Fatty cuts of meat. Ribs. Fried meat. Berniece Salines. Sausage. Bologna and other processed lunch meats. Salami. Fatback. Hotdogs. Bratwurst. Salted nuts and seeds. Canned beans with added salt. Canned or smoked fish. Whole eggs or egg yolks. Chicken or [place] with skin. Dairy Whole  or 2% milk, cream, and half-and-half. Whole or full-fat cream cheese. Whole-fat or sweetened yogurt. Full-fat cheese. Nondairy creamers. Whipped toppings. Processed cheese and cheese spreads. Fats and oils Butter. Stick margarine. Lard. Shortening. Ghee. Bacon fat. Tropical oils, such as coconut, palm kernel, or palm oil. Seasoning and other foods Salted popcorn and pretzels. Onion salt, garlic salt, seasoned salt, table salt, and sea salt. Worcestershire sauce. Tartar sauce. Barbecue sauce. Teriyaki sauce. Soy sauce, including reduced-sodium. Steak sauce. Canned and packaged gravies. Fish sauce. Oyster sauce. Cocktail sauce. Horseradish that you find on the shelf. Ketchup. Mustard. Meat flavorings and tenderizers. Bouillon cubes. Hot sauce and Tabasco sauce. Premade or packaged marinades. Premade or packaged taco seasonings. Relishes. Regular salad dressings. Where to find more information:  National Heart, Lung, and [place]: https://wilson-eaton.com/  American Heart Association: www.heart.org Summary  The DASH eating plan is a healthy eating plan that has been shown to reduce high blood pressure (hypertension). It may also reduce your risk for type 2 diabetes, heart disease, and stroke.  With the DASH eating plan, you should limit salt (sodium) intake to 2,300 mg a day. If you have hypertension, you may need to reduce your sodium intake to 1,500 mg a day.  When on the DASH eating plan, aim to eat more fresh fruits and vegetables, whole grains, lean proteins, low-fat  dairy, and heart-healthy fats.  Work with your health care provider or diet and nutrition specialist (dietitian) to adjust your eating plan to your individual calorie needs. This information is not intended to replace advice given to you by your health care provider. Make sure you discuss any questions you have with your health care provider. Document Revised: 12/16/2016 Document Reviewed: 12/28/2015 Elsevier Patient Education  2020 [employer].   Managing Your Hypertension Hypertension is commonly called high blood pressure. This is when the force of your blood pressing against the walls of your arteries is too strong. Arteries are blood vessels that carry blood from your heart throughout your body. Hypertension forces the heart to work harder to pump blood, and may cause the arteries to become narrow or stiff. Having untreated or uncontrolled hypertension can cause heart attack, stroke, kidney disease, and other problems. What are blood pressure readings? A blood pressure reading consists of a higher number over a lower number. Ideally, your blood pressure should be below 120/80. The first ("top") number is called the systolic pressure. It is a measure of the pressure in your arteries as your heart beats. The second ("bottom") number is called the diastolic pressure. It is a measure of the pressure in your arteries as the heart relaxes. What does my blood pressure reading mean? Blood pressure is classified into four stages. Based on your blood pressure reading, your health care provider may use the following stages to determine what type of treatment you need, if any. Systolic pressure and diastolic pressure are measured in a unit called mm Hg. Normal  Systolic pressure: below 950.  Diastolic pressure: below 80. Elevated  Systolic pressure: 932-671.  Diastolic pressure: below 80. Hypertension stage 1  Systolic pressure: 245-809.  Diastolic pressure: 98-33. Hypertension stage  2  Systolic pressure: 825 or above.  Diastolic pressure: 90 or above. What health risks are associated with hypertension? Managing your hypertension is an important responsibility. Uncontrolled hypertension can lead to:  A heart attack.  A stroke.  A weakened blood vessel (aneurysm).  Heart failure.  Kidney damage.  Eye damage.  Metabolic syndrome.  Memory and concentration problems. What  changes can I make to manage my hypertension? Hypertension can be managed by making lifestyle changes and possibly by taking medicines. Your health care provider will help you make a plan to bring your blood pressure within a normal range. Eating and drinking   Eat a diet that is high in fiber and potassium, and low in salt (sodium), added sugar, and fat. An example eating plan is called the DASH (Dietary Approaches to Stop Hypertension) diet. To eat this way: ? Eat plenty of fresh fruits and vegetables. Try to fill half of your plate at each meal with fruits and vegetables. ? Eat whole grains, such as whole wheat pasta, brown rice, or whole grain bread. Fill about one quarter of your plate with whole grains. ? Eat low-fat diary products. ? Avoid fatty cuts of meat, processed or cured meats, and poultry with skin. Fill about one quarter of your plate with lean proteins such as fish, chicken without skin, beans, eggs, and tofu. ? Avoid premade and processed foods. These tend to be higher in sodium, added sugar, and fat.  Reduce your daily sodium intake. Most people with hypertension should eat less than 1,500 mg of sodium a day.  Limit alcohol intake to no more than 1 drink a day for nonpregnant women and 2 drinks a day for men. One drink equals 12 oz of beer, 5 oz of wine, or 1 oz of hard liquor. Lifestyle  Work with your health care provider to maintain a healthy body weight, or to lose weight. Ask what an ideal weight is for you.  Get at least 30 minutes of exercise that causes your  heart to beat faster (aerobic exercise) most days of the week. Activities may include walking, swimming, or biking.  Include exercise to strengthen your muscles (resistance exercise), such as weight lifting, as part of your weekly exercise routine. Try to do these types of exercises for 30 minutes at least 3 days a week.  Do not use any products that contain nicotine or tobacco, such as cigarettes and e-cigarettes. If you need help quitting, ask your health care provider.  Control any long-term (chronic) conditions you have, such as high cholesterol or diabetes. Monitoring  Monitor your blood pressure at home as told by your health care provider. Your personal target blood pressure may vary depending on your medical conditions, your age, and other factors.  Have your blood pressure checked regularly, as often as told by your health care provider. Working with your health care provider  Review all the medicines you take with your health care provider because there may be side effects or interactions.  Talk with your health care provider about your diet, exercise habits, and other lifestyle factors that may be contributing to hypertension.  Visit your health care provider regularly. Your health care provider can help you create and adjust your plan for managing hypertension. Will I need medicine to control my blood pressure? Your health care provider may prescribe medicine if lifestyle changes are not enough to get your blood pressure under control, and if:  Your systolic blood pressure is 130 or higher.  Your diastolic blood pressure is 80 or higher. Take medicines only as told by your health care provider. Follow the directions carefully. Blood pressure medicines must be taken as prescribed. The medicine does not work as well when you skip doses. Skipping doses also puts you at risk for problems. Contact a health care provider if:  You think you are having a reaction to  medicines you have  taken.  You have repeated (recurrent) headaches.  You feel dizzy.  You have swelling in your ankles.  You have trouble with your vision. Get help right away if:  You develop a severe headache or confusion.  You have unusual weakness or numbness, or you feel faint.  You have severe pain in your chest or abdomen.  You vomit repeatedly.  You have trouble breathing. Summary  Hypertension is when the force of blood pumping through your arteries is too strong. If this condition is not controlled, it may put you at risk for serious complications.  Your personal target blood pressure may vary depending on your medical conditions, your age, and other factors. For most people, a normal blood pressure is less than 120/80.  Hypertension is managed by lifestyle changes, medicines, or both. Lifestyle changes include weight loss, eating a healthy, low-sodium diet, exercising more, and limiting alcohol. This information is not intended to replace advice given to you by your health care provider. Make sure you discuss any questions you have with your health care provider. Document Revised: 04/27/2018 Document Reviewed: 12/02/2015 Elsevier Patient Education  [employer].  This information is not intended to replace advice given to you by your health care provider. Make sure you discuss any questions you have with your health care provider. Document Revised: 09/13/2017 Document Reviewed: 09/13/2017 Elsevier Patient Education  [place].  Influenza (Flu) Vaccine (Inactivated or Recombinant): What You Need to Know 1. Why get vaccinated? Influenza vaccine can prevent influenza (flu). Flu is a contagious disease that spreads around the [place] every year, usually between October and May. Anyone can get the flu, but it is more dangerous for some people. Infants and young children, people 23 years of age and older, pregnant women, and people with certain health conditions or a  weakened immune system are at greatest risk of flu complications. Pneumonia, bronchitis, sinus infections and ear infections are examples of flu-related complications. If you have a medical condition, such as heart disease, cancer or diabetes, flu can make it worse. Flu can cause fever and chills, sore throat, muscle aches, fatigue, cough, headache, and runny or stuffy nose. Some people may have vomiting and diarrhea, though this is more common in children than adults. Each year thousands of people in the [place] die from flu, and many more are hospitalized. Flu vaccine prevents millions of illnesses and flu-related visits to the doctor each year. 2. Influenza vaccine CDC recommends everyone 19 months of age and older get vaccinated every flu season. Children 6 months through 70 years of age may need 2 doses during a single flu season. Everyone else needs only 1 dose each flu season. It takes about 2 weeks for protection to develop after vaccination. There are many flu viruses, and they are always changing. Each year a new flu vaccine is made to protect against three or four viruses that are likely to cause disease in the upcoming flu season. Even when the vaccine doesn't exactly match these viruses, it may still provide some protection. Influenza vaccine does not cause flu. Influenza vaccine may be given at the same time as other vaccines. 3. Talk with your health care provider Tell your vaccine provider if the person getting the vaccine:  Has had an allergic reaction after a previous dose of influenza vaccine, or has any severe, life-threatening allergies.  Has ever had Guillain-Barr Syndrome (also called GBS). In some cases, your health care provider may decide to  postpone influenza vaccination to a future visit. People with minor illnesses, such as a cold, may be vaccinated. People who are moderately or severely ill should usually wait until they recover before getting influenza  vaccine. Your health care provider can give you more information. 4. Risks of a vaccine reaction  Soreness, redness, and swelling where shot is given, fever, muscle aches, and headache can happen after influenza vaccine.  There may be a very small increased risk of Guillain-Barr Syndrome (GBS) after inactivated influenza vaccine (the flu shot). Young children who get the flu shot along with pneumococcal vaccine (PCV13), and/or DTaP vaccine at the same time might be slightly more likely to have a seizure caused by fever. Tell your health care provider if a child who is getting flu vaccine has ever had a seizure. People sometimes faint after medical procedures, including vaccination. Tell your provider if you feel dizzy or have vision changes or ringing in the ears. As with any medicine, there is a very remote chance of a vaccine causing a severe allergic reaction, other serious injury, or death. 5. What if there is a serious problem? An allergic reaction could occur after the vaccinated person leaves the clinic. If you see signs of a severe allergic reaction (hives, swelling of the face and throat, difficulty breathing, a fast heartbeat, dizziness, or weakness), call 9-1-1 and get the person to the nearest hospital. For other signs that concern you, call your health care provider. Adverse reactions should be reported to the Vaccine Adverse Event Reporting System (VAERS). Your health care provider will usually file this report, or you can do it yourself. Visit the VAERS website at www.vaers.SamedayNews.es or call 903-179-6094.VAERS is only for reporting reactions, and VAERS staff do not give medical advice. 6. The National Vaccine Injury Compensation Program The [employer] Vaccine Injury Compensation Program (VICP) is a federal program that was created to compensate people who may have been injured by certain vaccines. Visit the VICP website at GoldCloset.com.ee or call 256-561-5882 to learn  about the program and about filing a claim. There is a time limit to file a claim for compensation. 7. How can I learn more?  Ask your healthcare provider.  Call your local or state health department.  Contact the Centers for Disease Control and Prevention (CDC): ? Call (570) 661-1398 (1-800-CDC-INFO) or ? Visit CDC's https://gibson.com/ Vaccine Information Statement (Interim) Inactivated Influenza Vaccine (08/31/2017) This information is not intended to replace advice given to you by your health care provider. Make sure you discuss any questions you have with your health care provider. Document Revised: 04/24/2018 Document Reviewed: 09/04/2017 Elsevier Patient Education  [place].

## 2020-01-20 ENCOUNTER — Telehealth (INDEPENDENT_AMBULATORY_CARE_PROVIDER_SITE_OTHER): Payer: 59 | Admitting: Family Medicine

## 2020-01-20 ENCOUNTER — Encounter: Payer: Self-pay | Admitting: Family Medicine

## 2020-01-20 VITALS — BP 140/82 | HR 72

## 2020-01-20 DIAGNOSIS — L989 Disorder of the skin and subcutaneous tissue, unspecified: Secondary | ICD-10-CM | POA: Diagnosis not present

## 2020-01-20 DIAGNOSIS — I10 Essential (primary) hypertension: Secondary | ICD-10-CM | POA: Diagnosis not present

## 2020-01-20 MED ORDER — LISINOPRIL 5 MG PO TABS: 5.0000 mg | ORAL_TABLET | Freq: Every day | ORAL | 1 refills | Status: DC

## 2020-01-20 NOTE — Assessment & Plan Note (Addendum)
Well controlled. Continue current regimen. Follow up in  6 months.  Due for updated medical function and potassium.  For screening lipid as well.

## 2020-01-20 NOTE — Progress Notes (Addendum)
Virtual Visit via Video Note  I connected with Liberty Handy on 01/20/20 at  8:50 AM EST by a video enabled telemedicine application and verified that I am speaking with the correct person using two identifiers.   I discussed the limitations of evaluation and management by telemedicine and the availability of in person appointments. The patient expressed understanding and agreed to proceed.  Patient location: athome Provider location: in office  Subjective:    CC: HTN  HPI: Hypertension- Pt denies chest pain, SOB, dizziness, or heart palpitations.  Taking meds as directed w/o problems.  Denies medication side effects.  Reports diet hasn't been great this past month with the Holidays.  116/74 mid-months.  In the last couple weeks blood pressures have been much higher and not at goal.  Running in the low one forties.  Taking it night.   Couple of Lesions on his face. That noticed recently. No itching or bleeding.   Wants to know if should get a booster.  Is been 6 months since his first series.   Past medical history, Surgical history, Family history not pertinant except as noted below, Social history, Allergies, and medications have been entered into the medical record, reviewed, and corrections made.   Review of Systems: No fevers, chills, night sweats, weight loss, chest pain, or shortness of breath.   Objective:    General: Speaking clearly in complete sentences without any shortness of breath.  Alert and oriented x3.  Normal judgment. No apparent acute distress.    Impression and Recommendations:    Primary hypertension Well controlled. Continue current regimen. Follow up in  6 months.  Due for updated medical function and potassium.  For screening lipid as well.  Skin lesions on face-I be happy to see him so that we can better evaluate these lesions to see if they might be able to be treated or monitor to refer to dermatology.  He will schedule at his  convenience.  Encouraged him to get the COVID booster.     Time spent in encounter 18 minutes  I discussed the assessment and treatment plan with the patient. The patient was provided an opportunity to ask questions and all were answered. The patient agreed with the plan and demonstrated an understanding of the instructions.   The patient was advised to call back or seek an in-person evaluation if the symptoms worsen or if the condition fails to improve as anticipated.   Nani Gasser, MD

## 2020-01-20 NOTE — Progress Notes (Signed)
Pt reports that he took his last bp tablet yesterday.   Checked his medication history and he was given a 90 day supply on 12/19/2019 sent to his local pharmacy.

## 2020-07-15 ENCOUNTER — Other Ambulatory Visit: Payer: Self-pay | Admitting: Family Medicine

## 2020-07-15 DIAGNOSIS — I10 Essential (primary) hypertension: Secondary | ICD-10-CM

## 2020-07-16 ENCOUNTER — Other Ambulatory Visit: Payer: Self-pay | Admitting: Gastroenterology

## 2020-07-23 LAB — COMPLETE METABOLIC PANEL WITH GFR
AG Ratio: 1.7 (calc) (ref 1.0–2.5)
ALT: 15 U/L (ref 9–46)
AST: 21 U/L (ref 10–40)
Albumin: 4.3 g/dL (ref 3.6–5.1)
Alkaline phosphatase (APISO): 57 U/L (ref 36–130)
BUN: 12 mg/dL (ref 7–25)
CO2: 28 mmol/L (ref 20–32)
Calcium: 9.6 mg/dL (ref 8.6–10.3)
Chloride: 106 mmol/L (ref 98–110)
Creat: 1.15 mg/dL (ref 0.60–1.35)
GFR, Est African American: 89 mL/min/{1.73_m2} (ref >=60)
GFR, Est Non African American: 77 mL/min/{1.73_m2} (ref >=60)
Globulin: 2.5 g/dL (calc) (ref 1.9–3.7)
Glucose, Bld: 85 mg/dL (ref 65–99)
Potassium: 4.4 mmol/L (ref 3.5–5.3)
Sodium: 140 mmol/L (ref 135–146)
Total Bilirubin: 1 mg/dL (ref 0.2–1.2)
Total Protein: 6.8 g/dL (ref 6.1–8.1)

## 2020-07-23 LAB — LIPID PANEL W/REFLEX DIRECT LDL
Cholesterol: 182 mg/dL (ref <200)
HDL: 50 mg/dL (ref >=40)
LDL Cholesterol (Calc): 114 mg/dL (calc) — ABNORMAL HIGH
Non-HDL Cholesterol (Calc): 132 mg/dL (calc) — ABNORMAL HIGH (ref <130)
Total CHOL/HDL Ratio: 3.6 (calc) (ref <5.0)
Triglycerides: 85 mg/dL (ref <150)

## 2020-07-24 ENCOUNTER — Ambulatory Visit (INDEPENDENT_AMBULATORY_CARE_PROVIDER_SITE_OTHER): Payer: 59 | Admitting: Family Medicine

## 2020-07-24 ENCOUNTER — Other Ambulatory Visit: Payer: Self-pay

## 2020-07-24 ENCOUNTER — Encounter: Payer: Self-pay | Admitting: Family Medicine

## 2020-07-24 VITALS — BP 108/54 | HR 58 | Ht 71.0 in | Wt 181.0 lb

## 2020-07-24 DIAGNOSIS — Z0000 Encounter for general adult medical examination without abnormal findings: Secondary | ICD-10-CM

## 2020-07-24 DIAGNOSIS — Z1211 Encounter for screening for malignant neoplasm of colon: Secondary | ICD-10-CM | POA: Diagnosis not present

## 2020-07-24 NOTE — Progress Notes (Signed)
CPE  Established Patient Office Visit  Subjective:  Patient ID: Micheal Brewer, male    DOB: Nov 14, 1975  Age: 45 y.o. MRN: [id number]  CC:  Chief Complaint  Patient presents with   Annual Exam    HPI Micheal Brewer presents for CPE.  He exercises regularly multiple days a week.  He is currently on doxycycline more chronically for an eye condition and he sees Dr. Lajoyce Corners for this.  He denies any recent GI symptoms he is interested in doing a colonoscopy for colon cancer screening as he turns 45 next month.  He feels like his mood is good overall and has no other specific concerns today he would like me to look at a mole on his forehead.  Past Medical History:  Diagnosis Date   Primary hypertension 12/19/2019   Trigeminal neuralgia pain 07/30/2019    Past Surgical History:  Procedure Laterality Date   NO PAST SURGERIES      Family History  Problem Relation Age of Onset   Heart failure Mother    Hypertension Brother    Hypertension Father    Colon polyps Father    Colon cancer Paternal Uncle        great uncle    Prostate cancer Paternal Grandfather    Esophageal cancer Neg Hx    Rectal cancer Neg Hx    Stomach cancer Neg Hx     Social History   Socioeconomic History   Marital status: Married    Spouse name: Teacher, music   Number of children: 2   Years of education: Not on file   Highest education level: Not on file  Occupational History   Occupation: Curator: UNEMPLOYED  Tobacco Use   Smoking status: Never   Smokeless tobacco: Never  Vaping Use   Vaping Use: Never used  Substance and Sexual Activity   Alcohol use: Yes    Alcohol/week: 6.0 - 8.0 standard drinks    Types: 6 - 8 Standard drinks or equivalent per week   Drug use: No   Sexual activity: Not on file  Other Topics Concern   Not on file  Social History Narrative   Works out regularly.    Social Determinants of Health   Financial Resource Strain: Not on file  Food Insecurity: Not  on file  Transportation Needs: Not on file  Physical Activity: Not on file  Stress: Not on file  Social Connections: Not on file  Intimate Partner Violence: Not on file    Outpatient Medications Prior to Visit  Medication Sig Dispense Refill   doxycycline (ADOXA) 50 MG tablet Take 50 mg by mouth daily.     lisinopril (ZESTRIL) 5 MG tablet TAKE 1 TABLET (5 MG TOTAL) BY MOUTH DAILY. 30 tablet 5   pantoprazole (PROTONIX) 20 MG tablet TAKE 1 TABLET BY MOUTH TWICE A DAY 60 tablet 0   No facility-administered medications prior to visit.    Allergies  Allergen Reactions   Penicillins Rash    REACTION: rash    ROS Review of Systems    Objective:    Physical Exam Constitutional:      Appearance: Normal appearance. He is well-developed.  HENT:     Head: Normocephalic and atraumatic.  Cardiovascular:     Rate and Rhythm: Normal rate and regular rhythm.     Heart sounds: Normal heart sounds.  Pulmonary:     Effort: Pulmonary effort is normal.     Breath sounds: Normal  breath sounds.  Skin:    General: Skin is warm and dry.  Neurological:     Mental Status: He is alert and oriented to person, place, and time. Mental status is at baseline.  Psychiatric:        Behavior: Behavior normal.   Light brown mole on forehead most consistent with seborrheic keratosis.  BP (!) 108/54   Pulse (!) 58   Ht 5\' 11"  (1.803 m)   Wt 181 lb (82.1 kg)   SpO2 97%   BMI 25.24 kg/m  Wt Readings from Last 3 Encounters:  07/24/20 181 lb (82.1 kg)  12/19/19 184 lb 1.6 oz (83.5 kg)  07/30/19 182 lb (82.6 kg)     Health Maintenance Due  Topic Date Due   COVID-19 Vaccine (3 - Booster for Moderna series) 10/02/2019    There are no preventive care reminders to display for this patient.  No results found for: TSH Lab Results  Component Value Date   WBC 5.0 01/28/2019   HGB 14.8 01/28/2019   HCT 41.7 01/28/2019   MCV 96.3 01/28/2019   PLT 225 01/28/2019   Lab Results  Component Value  Date   NA 140 07/23/2020   K 4.4 07/23/2020   CO2 28 07/23/2020   GLUCOSE 85 07/23/2020   BUN 12 07/23/2020   CREATININE 1.15 07/23/2020   BILITOT 1.0 07/23/2020   ALKPHOS 62 10/03/2011   AST 21 07/23/2020   ALT 15 07/23/2020   PROT 6.8 07/23/2020   ALBUMIN 4.4 10/03/2011   CALCIUM 9.6 07/23/2020   Lab Results  Component Value Date   CHOL 182 07/23/2020   Lab Results  Component Value Date   HDL 50 07/23/2020   Lab Results  Component Value Date   LDLCALC 114 (H) 07/23/2020   Lab Results  Component Value Date   TRIG 85 07/23/2020   Lab Results  Component Value Date   CHOLHDL 3.6 07/23/2020   No results found for: HGBA1C    Assessment & Plan:   Problem List Items Addressed This Visit   None Visit Diagnoses     Wellness examination    -  Primary   Screen for colon cancer       Relevant Orders   Ambulatory referral to Gastroenterology      Keep up a regular exercise program and make sure you are eating a healthy diet Try to eat 4 servings of dairy a day, or if you are lactose intolerant take a calcium with vitamin D daily.  Your vaccines are up to date.    No orders of the defined types were placed in this encounter.   Follow-up: Return in about 6 months (around 01/24/2021) for Hypertension.    Beatrice Lecher, MD

## 2020-07-24 NOTE — Progress Notes (Signed)
Pt had labs done yesterday.

## 2020-08-15 ENCOUNTER — Other Ambulatory Visit: Payer: Self-pay | Admitting: Gastroenterology

## 2020-11-23 ENCOUNTER — Other Ambulatory Visit: Payer: Self-pay | Admitting: Gastroenterology

## 2020-12-01 ENCOUNTER — Encounter: Payer: Self-pay | Admitting: Gastroenterology

## 2020-12-04 ENCOUNTER — Other Ambulatory Visit: Payer: Self-pay | Admitting: Gastroenterology

## 2020-12-29 ENCOUNTER — Encounter: Payer: Self-pay | Admitting: *Deleted

## 2021-01-20 ENCOUNTER — Ambulatory Visit: Payer: 59 | Admitting: *Deleted

## 2021-01-20 ENCOUNTER — Other Ambulatory Visit: Payer: Self-pay

## 2021-01-20 VITALS — Ht 72.0 in | Wt 177.0 lb

## 2021-01-20 DIAGNOSIS — Z1211 Encounter for screening for malignant neoplasm of colon: Secondary | ICD-10-CM

## 2021-01-20 MED ORDER — NA SULFATE-K SULFATE-MG SULF 17.5-3.13-1.6 GM/177ML PO SOLN: 1.0000 | Freq: Once | ORAL | 0 refills | Status: AC

## 2021-01-20 NOTE — Progress Notes (Signed)

## 2021-01-24 ENCOUNTER — Other Ambulatory Visit: Payer: Self-pay | Admitting: Family Medicine

## 2021-01-24 ENCOUNTER — Other Ambulatory Visit: Payer: Self-pay | Admitting: Gastroenterology

## 2021-01-24 DIAGNOSIS — I10 Essential (primary) hypertension: Secondary | ICD-10-CM

## 2021-01-25 ENCOUNTER — Other Ambulatory Visit: Payer: Self-pay

## 2021-01-25 ENCOUNTER — Encounter: Payer: Self-pay | Admitting: Family Medicine

## 2021-01-25 ENCOUNTER — Ambulatory Visit: Payer: 59 | Admitting: Family Medicine

## 2021-01-25 DIAGNOSIS — I10 Essential (primary) hypertension: Secondary | ICD-10-CM

## 2021-01-25 MED ORDER — LISINOPRIL 10 MG PO TABS: 10.0000 mg | ORAL_TABLET | Freq: Every day | ORAL | 1 refills | Status: DC

## 2021-01-25 NOTE — Assessment & Plan Note (Signed)
BP at goal but getting some BPS in the 130s at home. Will inc lisinopirl to 10mg . Due for BMP, go in 1-2 weeks after start new medication. F/U in 6 mo

## 2021-01-25 NOTE — Progress Notes (Signed)
Established Patient Office Visit  Subjective:  Patient ID: Micheal Brewer, male    DOB: 1975-08-07  Age: 46 y.o. MRN: [id number]  CC:  Chief Complaint  Patient presents with   Hypertension    Follow up     HPI Micheal Brewer presents for   Hypertension- Pt denies chest pain, SOB, dizziness, or heart palpitations.  Taking meds as directed w/o problems.  Denies medication side effects.  Reports home BPs from 120s to lower 130s   Past Medical History:  Diagnosis Date   Primary hypertension 12/19/2019   Trigeminal neuralgia pain 07/30/2019    Past Surgical History:  Procedure Laterality Date   UPPER GASTROINTESTINAL ENDOSCOPY     02-2019, 04-2019    Family History  Problem Relation Age of Onset   Heart failure Mother    Hypertension Brother    Hypertension Father    Colon polyps Father    Colon cancer Paternal Uncle        great uncle    Prostate cancer Paternal Grandfather    Esophageal cancer Neg Hx    Rectal cancer Neg Hx    Stomach cancer Neg Hx     Social History   Socioeconomic History   Marital status: Married    Spouse name: Teacher, music   Number of children: 2   Years of education: Not on file   Highest education level: Not on file  Occupational History   Occupation: Curator: UNEMPLOYED  Tobacco Use   Smoking status: Never   Smokeless tobacco: Never  Vaping Use   Vaping Use: Never used  Substance and Sexual Activity   Alcohol use: Yes    Alcohol/week: 6.0 - 8.0 standard drinks    Types: 6 - 8 Standard drinks or equivalent per week   Drug use: No   Sexual activity: Not on file  Other Topics Concern   Not on file  Social History Narrative   Works out regularly.    Social Determinants of Health   Financial Resource Strain: Not on file  Food Insecurity: Not on file  Transportation Needs: Not on file  Physical Activity: Not on file  Stress: Not on file  Social Connections: Not on file  Intimate Partner Violence: Not on  file    Outpatient Medications Prior to Visit  Medication Sig Dispense Refill   doxycycline (ADOXA) 50 MG tablet Take 50 mg by mouth daily.     Omega-3 Fatty Acids (FISH OIL) 1200 MG CAPS Take by mouth.     pantoprazole (PROTONIX) 20 MG tablet TAKE 1 TAB BY MOUTH 2 TIMES DAILY. PLEASE CALL [phone] TO SCHEDULE AN OFFICE VISIT FOR MORE REFILLS. 60 tablet 0   lisinopril (ZESTRIL) 5 MG tablet TAKE 1 TABLET (5 MG TOTAL) BY MOUTH DAILY. 30 tablet 0   No facility-administered medications prior to visit.    Allergies  Allergen Reactions   Penicillins Rash    REACTION: rash    ROS Review of Systems    Objective:    Physical Exam Constitutional:      Appearance: Normal appearance. He is well-developed.  HENT:     Head: Normocephalic and atraumatic.  Cardiovascular:     Rate and Rhythm: Normal rate and regular rhythm.     Heart sounds: Normal heart sounds.  Pulmonary:     Effort: Pulmonary effort is normal.     Breath sounds: Normal breath sounds.  Skin:    General: Skin is warm and  dry.  Neurological:     Mental Status: He is alert and oriented to person, place, and time. Mental status is at baseline.  Psychiatric:        Behavior: Behavior normal.    BP 130/79    Pulse 81    Temp 98.6 F (37 C)    Resp 18    Ht 6' (1.829 m)    Wt 186 lb (84.4 kg)    SpO2 100%    BMI 25.23 kg/m  Wt Readings from Last 3 Encounters:  01/25/21 186 lb (84.4 kg)  01/20/21 177 lb (80.3 kg)  07/24/20 181 lb (82.1 kg)     Health Maintenance Due  Topic Date Due   HIV Screening  Never done   Hepatitis C Screening  Never done   COLONOSCOPY (Pts 45-44yrs Insurance coverage will need to be confirmed)  Never done    There are no preventive care reminders to display for this patient.  No results found for: TSH Lab Results  Component Value Date   WBC 5.0 01/28/2019   HGB 14.8 01/28/2019   HCT 41.7 01/28/2019   MCV 96.3 01/28/2019   PLT 225 01/28/2019   Lab Results  Component Value  Date   NA 140 07/23/2020   K 4.4 07/23/2020   CO2 28 07/23/2020   GLUCOSE 85 07/23/2020   BUN 12 07/23/2020   CREATININE 1.15 07/23/2020   BILITOT 1.0 07/23/2020   ALKPHOS 62 10/03/2011   AST 21 07/23/2020   ALT 15 07/23/2020   PROT 6.8 07/23/2020   ALBUMIN 4.4 10/03/2011   CALCIUM 9.6 07/23/2020   Lab Results  Component Value Date   CHOL 182 07/23/2020   Lab Results  Component Value Date   HDL 50 07/23/2020   Lab Results  Component Value Date   LDLCALC 114 (H) 07/23/2020   Lab Results  Component Value Date   TRIG 85 07/23/2020   Lab Results  Component Value Date   CHOLHDL 3.6 07/23/2020   No results found for: HGBA1C    Assessment & Plan:   Problem List Items Addressed This Visit       Cardiovascular and Mediastinum   Primary hypertension    BP at goal but getting some BPS in the 130s at home. Will inc lisinopirl to 10mg . Due for BMP, go in 1-2 weeks after start new medication. F/U in 6 mo       Relevant Medications   lisinopril (ZESTRIL) 10 MG tablet   Other Relevant Orders   BASIC METABOLIC PANEL WITH GFR   Medications refilled today.     Meds ordered this encounter  Medications   lisinopril (ZESTRIL) 10 MG tablet    Sig: Take 1 tablet (10 mg total) by mouth daily.    Dispense:  90 tablet    Refill:  1    Pls cancel order for the 5mg  dose    Follow-up: Return in about 6 months (around 07/25/2021) for Hypertension.    Beatrice Lecher, MD

## 2021-01-25 NOTE — Progress Notes (Signed)
Patient has Colonoscopy scheduled 02/04/20 with [facility].

## 2021-01-28 ENCOUNTER — Encounter: Payer: Self-pay | Admitting: Gastroenterology

## 2021-01-29 ENCOUNTER — Encounter: Payer: 59 | Admitting: Gastroenterology

## 2021-02-03 ENCOUNTER — Encounter: Payer: Self-pay | Admitting: Gastroenterology

## 2021-02-03 ENCOUNTER — Ambulatory Visit (AMBULATORY_SURGERY_CENTER): Payer: 59 | Admitting: Gastroenterology

## 2021-02-03 VITALS — BP 104/62 | HR 56 | Temp 98.0°F | Resp 13 | Ht 71.0 in | Wt 179.0 lb

## 2021-02-03 DIAGNOSIS — D122 Benign neoplasm of ascending colon: Secondary | ICD-10-CM

## 2021-02-03 DIAGNOSIS — D120 Benign neoplasm of cecum: Secondary | ICD-10-CM

## 2021-02-03 DIAGNOSIS — Z1211 Encounter for screening for malignant neoplasm of colon: Secondary | ICD-10-CM | POA: Diagnosis present

## 2021-02-03 DIAGNOSIS — K635 Polyp of colon: Secondary | ICD-10-CM | POA: Diagnosis not present

## 2021-02-03 DIAGNOSIS — K640 First degree hemorrhoids: Secondary | ICD-10-CM

## 2021-02-03 MED ORDER — SODIUM CHLORIDE 0.9 % IV SOLN: 500.0000 mL | Freq: Once | INTRAVENOUS | Status: DC

## 2021-02-03 NOTE — Patient Instructions (Signed)
Please read handouts provided. °Continue present medications. °Await pathology results. °Return to GI office as needed. ° ° °YOU HAD AN ENDOSCOPIC PROCEDURE TODAY AT THE [facility]:   Refer to the procedure report that was given to you for any specific questions about what was found during the examination.  If the procedure report does not answer your questions, please call your gastroenterologist to clarify.  If you requested that your care partner not be given the details of your procedure findings, then the procedure report has been included in a sealed envelope for you to review at your convenience later. ° °YOU SHOULD EXPECT: Some feelings of bloating in the abdomen. Passage of more gas than usual.  Walking can help get rid of the air that was put into your GI tract during the procedure and reduce the bloating. If you had a lower endoscopy (such as a colonoscopy or flexible sigmoidoscopy) you may notice spotting of blood in your stool or on the toilet paper. If you underwent a bowel prep for your procedure, you may not have a normal bowel movement for a few days. ° °Please Note:  You might notice some irritation and congestion in your nose or some drainage.  This is from the oxygen used during your procedure.  There is no need for concern and it should clear up in a day or so. ° °SYMPTOMS TO REPORT IMMEDIATELY: ° °Following lower endoscopy (colonoscopy or flexible sigmoidoscopy): ° Excessive amounts of blood in the stool ° Significant tenderness or worsening of abdominal pains ° Swelling of the abdomen that is new, acute ° Fever of 100°F or higher ° ° °For urgent or emergent issues, a gastroenterologist can be reached at any hour by calling [phone]. °Do not use MyChart messaging for urgent concerns.  ° ° °DIET:  We do recommend a small meal at first, but then you may proceed to your regular diet.  Drink plenty of fluids but you should avoid alcoholic beverages for 24 hours. ° °ACTIVITY:   You should plan to take it easy for the rest of today and you should NOT DRIVE or use heavy machinery until tomorrow (because of the sedation medicines used during the test).   ° °FOLLOW UP: °Our staff will call the number listed on your records 48-72 hours following your procedure to check on you and address any questions or concerns that you may have regarding the information given to you following your procedure. If we do not reach you, we will leave a message.  We will attempt to reach you two times.  During this call, we will ask if you have developed any symptoms of COVID 19. If you develop any symptoms (ie: fever, flu-like symptoms, shortness of breath, cough etc.) before then, please call [phone].  If you test positive for Covid 19 in the 2 weeks post procedure, please call and report this information to us.   ° °If any biopsies were taken you will be contacted by phone or by letter within the next 1-3 weeks.  Please call us at [phone] if you have not heard about the biopsies in 3 weeks.  ° ° °SIGNATURES/CONFIDENTIALITY: °You and/or your care partner have signed paperwork which will be entered into your electronic medical record.  These signatures attest to the fact that that the information above on your After Visit Summary has been reviewed and is understood.  Full responsibility of the confidentiality of this discharge information lies with you and/or your care-partner.  °

## 2021-02-03 NOTE — Op Note (Signed)
[place] Patient Name: Micheal Brewer Procedure Date: 02/03/2021 9:42 AM MRN: [id number] Endoscopist: [name] , MD Age: 46 Referring MD:  Date of Birth: 1975-12-16 Gender: Male Account #: [id number] Procedure:                Colonoscopy Indications:              Screening for colorectal malignant neoplasm, This                            is the patient's first colonoscopy Medicines:                Monitored Anesthesia Care Procedure:                Pre-Anesthesia Assessment:                           - Prior to the procedure, a History and Physical                            was performed, and patient medications and                            allergies were reviewed. The patient's tolerance of                            previous anesthesia was also reviewed. The risks                            and benefits of the procedure and the sedation                            options and risks were discussed with the patient.                            All questions were answered, and informed consent                            was obtained. Prior Anticoagulants: The patient has                            taken no previous anticoagulant or antiplatelet                            agents. ASA Grade Assessment: II - A patient with                            mild systemic disease. After reviewing the risks                            and benefits, the patient was deemed in                            satisfactory condition to undergo the procedure.  After obtaining informed consent, the colonoscope                            was passed under direct vision. Throughout the                            procedure, the patient's blood pressure, pulse, and                            oxygen saturations were monitored continuously. The                            Olympus CF-HQ190L ([id number]) Colonoscope was                            introduced through the anus and  advanced to the the                            terminal ileum. The colonoscopy was performed                            without difficulty. The patient tolerated the                            procedure well. The quality of the bowel                            preparation was good. The terminal ileum, ileocecal                            valve, appendiceal orifice, and rectum were                            photographed. Scope In: 10:00:47 AM Scope Out: 10:13:20 AM Scope Withdrawal Time: 0 hours 10 minutes 47 seconds  Total Procedure Duration: 0 hours 12 minutes 33 seconds  Findings:                 The perianal and digital rectal examinations were                            normal.                           Two sessile polyps were found in the ascending                            colon and cecum. The polyps were 4 to 5 mm in size                            and each had an ahderent mucus cap. These polyps                            were removed with a cold snare. Resection and  retrieval were complete. Estimated blood loss was                            minimal.                           Non-bleeding internal hemorrhoids were found during                            retroflexion. The hemorrhoids were small and Grade                            I (internal hemorrhoids that do not prolapse).                           The terminal ileum appeared normal. Complications:            No immediate complications. Estimated Blood Loss:     Estimated blood loss was minimal. Impression:               - Two 4 to 5 mm polyps in the ascending colon and                            in the cecum, removed with a cold snare. Resected                            and retrieved.                           - Non-bleeding internal hemorrhoids.                           - The examined portion of the ileum was normal. Recommendation:           - Patient has a contact number available for                             emergencies. The signs and symptoms of potential                            delayed complications were discussed with the                            patient. Return to normal activities tomorrow.                            Written discharge instructions were provided to the                            patient.                           - Resume previous diet.                           - Continue present medications.                           -  Await pathology results.                           - Repeat colonoscopy for surveillance based on                            pathology results.                           - Return to GI office PRN.                           - Use fiber, for example Citrucel, Fibercon, Konsyl                            or Metamucil.                           - Internal hemorrhoids were noted on this study and                            may be amenable to hemorrhoid band ligation. If you                            are interested in further treatment of these                            hemorrhoids with band ligation, please contact my                            clinic to set up an appointment for evaluation and                            treatment. [name], MD 02/03/2021 10:17:47 AM

## 2021-02-03 NOTE — Progress Notes (Signed)
Pt non-responsive, VVS, Report to RN  °

## 2021-02-03 NOTE — Progress Notes (Signed)
GASTROENTEROLOGY PROCEDURE H&P NOTE   Primary Care Physician: Hali Marry, MD    Reason for Procedure:  Colon Cancer screening  Plan:    Colonoscopy  Patient is appropriate for endoscopic procedure(s) in the ambulatory ([facility]) setting.  The nature of the procedure, as well as the risks, benefits, and alternatives were carefully and thoroughly reviewed with the patient. Ample time for discussion and questions allowed. The patient understood, was satisfied, and agreed to proceed.     HPI: Micheal Brewer is a 46 y.o. male who presents for colonoscopy for routine Colon Cancer screening.  No active GI symptoms.  No known family history of colon cancer or related malignancy.  Patient is otherwise without complaints or active issues today.  Past Medical History:  Diagnosis Date   Primary hypertension 12/19/2019   Trigeminal neuralgia pain 07/30/2019    Past Surgical History:  Procedure Laterality Date   UPPER GASTROINTESTINAL ENDOSCOPY     02-2019, 04-2019    Prior to Admission medications   Medication Sig Start Date End Date Taking? Authorizing Provider  doxycycline (ADOXA) 50 MG tablet Take 50 mg by mouth daily. 07/22/20  Yes Laurence Aly, OD  lisinopril (ZESTRIL) 10 MG tablet Take 1 tablet (10 mg total) by mouth daily. 01/25/21  Yes Hali Marry, MD  Omega-3 Fatty Acids (FISH OIL) 1200 MG CAPS Take by mouth.   Yes [provider]  pantoprazole (PROTONIX) 20 MG tablet TAKE 1 TAB BY MOUTH 2 TIMES DAILY. PLEASE CALL [phone] TO SCHEDULE AN OFFICE VISIT FOR MORE REFILLS. 01/25/21  Yes Billal Rollo, Dominic Pea, DO    Current Outpatient Medications  Medication Sig Dispense Refill   doxycycline (ADOXA) 50 MG tablet Take 50 mg by mouth daily.     lisinopril (ZESTRIL) 10 MG tablet Take 1 tablet (10 mg total) by mouth daily. 90 tablet 1   Omega-3 Fatty Acids (FISH OIL) 1200 MG CAPS Take by mouth.     pantoprazole (PROTONIX) 20 MG tablet TAKE 1 TAB BY MOUTH 2 TIMES  DAILY. PLEASE CALL [phone] TO SCHEDULE AN OFFICE VISIT FOR MORE REFILLS. 60 tablet 0   Current Facility-Administered Medications  Medication Dose Route Frequency Provider Last Rate Last Admin   0.9 %  sodium chloride infusion  500 mL Intravenous Once Nikeria Kalman V, DO        Allergies as of 02/03/2021 - Review Complete 02/03/2021  Allergen Reaction Noted   Penicillins Rash 12/09/2019    Family History  Problem Relation Age of Onset   Heart failure Mother    Hypertension Brother    Hypertension Father    Colon polyps Father    Colon cancer Paternal Uncle        great uncle    Prostate cancer Paternal Grandfather    Esophageal cancer Neg Hx    Rectal cancer Neg Hx    Stomach cancer Neg Hx     Social History   Socioeconomic History   Marital status: Married    Spouse name: Teacher, music   Number of children: 2   Years of education: Not on file   Highest education level: Not on file  Occupational History   Occupation: Curator: UNEMPLOYED  Tobacco Use   Smoking status: Never   Smokeless tobacco: Never  Vaping Use   Vaping Use: Never used  Substance and Sexual Activity   Alcohol use: Yes    Alcohol/week: 6.0 - 8.0 standard drinks    Types: 6 -  8 Standard drinks or equivalent per week   Drug use: No   Sexual activity: Not on file  Other Topics Concern   Not on file  Social History Narrative   Works out regularly.    Social Determinants of Health   Financial Resource Strain: Not on file  Food Insecurity: Not on file  Transportation Needs: Not on file  Physical Activity: Not on file  Stress: Not on file  Social Connections: Not on file  Intimate Partner Violence: Not on file    Physical Exam: Vital signs in last 24 hours: @BP  (!) 95/53    Pulse 67    Temp 98 F (36.7 C)    Ht 5\' 11"  (1.803 m)    Wt 179 lb (81.2 kg)    SpO2 100%    BMI 24.97 kg/m  GEN: NAD EYE: Sclerae anicteric ENT: MMM CV: Non-tachycardic Pulm: CTA b/l GI:  Soft, NT/ND NEURO:  Alert & Oriented x 3   [name], DO [facility]   02/03/2021 9:53 AM

## 2021-02-03 NOTE — Progress Notes (Signed)
VS taken by DT 

## 2021-02-03 NOTE — Progress Notes (Signed)
Called to room to assist during endoscopic procedure.  Patient ID and intended procedure confirmed with present staff. Received instructions for my participation in the procedure from the performing physician.  

## 2021-02-05 ENCOUNTER — Telehealth: Payer: Self-pay | Admitting: *Deleted

## 2021-02-05 NOTE — Telephone Encounter (Signed)
°  Follow up Call-  Call back number 02/03/2021 05/06/2019 03/01/2019  Post procedure Call Back phone  # [phone]  Permission to leave phone message Yes Yes Yes  Some recent data might be hidden     Patient questions:  Do you have a fever, pain , or abdominal swelling? No. Pain Score  0 *  Have you tolerated food without any problems? Yes.    Have you been able to return to your normal activities? Yes.    Do you have any questions about your discharge instructions: Diet   No. Medications  No. Follow up visit  No.  Do you have questions or concerns about your Care? No.  Actions: * If pain score is 4 or above: No action needed, pain <4.

## 2021-02-09 ENCOUNTER — Encounter: Payer: Self-pay | Admitting: Gastroenterology

## 2021-02-19 ENCOUNTER — Other Ambulatory Visit: Payer: Self-pay | Admitting: Gastroenterology

## 2021-04-18 ENCOUNTER — Other Ambulatory Visit: Payer: Self-pay

## 2021-04-18 ENCOUNTER — Encounter: Payer: Self-pay | Admitting: Emergency Medicine

## 2021-04-18 ENCOUNTER — Emergency Department
Admission: EM | Admit: 2021-04-18 | Discharge: 2021-04-18 | Disposition: A | Discharge status: Home / Self Care | Payer: 59 | Source: Home / Self Care | Attending: Family Medicine | Admitting: Family Medicine

## 2021-04-18 DIAGNOSIS — J22 Unspecified acute lower respiratory infection: Secondary | ICD-10-CM

## 2021-04-18 MED ORDER — AZITHROMYCIN 250 MG PO TABS: ORAL_TABLET | ORAL | 0 refills | Status: DC

## 2021-04-18 MED ORDER — BENZONATATE 200 MG PO CAPS: 200.0000 mg | ORAL_CAPSULE | Freq: Three times a day (TID) | ORAL | 0 refills | Status: DC | PRN

## 2021-04-18 NOTE — ED Provider Notes (Signed)
?[facility] ? ? ? ?CSN: [id number] ?Arrival date & time: 04/18/21  1402 ? ? ?  ? ?History   ?Chief Complaint ?Chief Complaint  ?Patient presents with  ? Cough  ? Nasal Congestion  ? ? ?HPI ?Micheal Brewer is a 46 y.o. male.  ? ?HPI ? ?Patient has been sick for 2 weeks.  Nasal congestion, sinus pressure and pain, intermittent sore throat, and cough.  The cough is persistent.  He took a home COVID test which was negative.  He has been taking Sudafed and over-the-counter medications.  The cough remains.  He is not a smoker.  No asthma.  He states that he he caught this at work from a coworker, "everyone had it" ? ?Past Medical History:  ?Diagnosis Date  ? Primary hypertension 12/19/2019  ? Trigeminal neuralgia pain 07/30/2019  ? ? ?Patient Active Problem List  ? Diagnosis Date Noted  ? Primary hypertension 12/19/2019  ? Facial pain 07/30/2019  ? Trigeminal neuralgia pain 07/30/2019  ? Pupil asymmetry 07/30/2019  ? Dysphagia 01/28/2019  ? Closed nondisplaced fracture of body of right hamate bone 03/11/2013  ? ? ?Past Surgical History:  ?Procedure Laterality Date  ? UPPER GASTROINTESTINAL ENDOSCOPY    ? 02-2019, 04-2019  ? ? ? ? ? ?Home Medications   ? ?Prior to Admission medications   ?Medication Sig Start Date End Date Taking? Authorizing Provider  ?azithromycin (ZITHROMAX Z-PAK) 250 MG tablet Take 2 tablets today.  Starting tomorrow take 1 a day until gone 04/18/21  Yes Raylene Everts, MD  ?benzonatate (TESSALON) 200 MG capsule Take 1 capsule (200 mg total) by mouth 3 (three) times daily as needed for cough. 04/18/21  Yes Raylene Everts, MD  ?doxycycline (ADOXA) 50 MG tablet Take 50 mg by mouth daily. 07/22/20  Yes Laurence Aly, OD  ?lisinopril (ZESTRIL) 10 MG tablet Take 1 tablet (10 mg total) by mouth daily. 01/25/21  Yes Hali Marry, MD  ?Omega-3 Fatty Acids (FISH OIL) 1200 MG CAPS Take by mouth.   Yes [provider]  ?pantoprazole (PROTONIX) 20 MG tablet TAKE 1 TAB BY MOUTH 2 TIMES  DAILY. PLEASE CALL [phone] TO SCHEDULE AN OFFICE VISIT FOR MORE REFILLS. 01/25/21  Yes Cirigliano, Vito V, DO  ? ? ?Family History ?Family History  ?Problem Relation Age of Onset  ? Heart failure Mother   ? Hypertension Brother   ? Hypertension Father   ? Colon polyps Father   ? Colon cancer Paternal Uncle   ?     great uncle   ? Prostate cancer Paternal Grandfather   ? Esophageal cancer Neg Hx   ? Rectal cancer Neg Hx   ? Stomach cancer Neg Hx   ? ? ?Social History ?Social History  ? ?Tobacco Use  ? Smoking status: Never  ? Smokeless tobacco: Never  ?Vaping Use  ? Vaping Use: Never used  ?Substance Use Topics  ? Alcohol use: Yes  ?  Alcohol/week: 6.0 - 8.0 standard drinks  ?  Types: 6 - 8 Standard drinks or equivalent per week  ? Drug use: No  ? ? ? ?Allergies   ?Penicillins ? ? ?Review of Systems ?Review of Systems ?See HPI ? ?Physical Exam ?Triage Vital Signs ?ED Triage Vitals  ?Enc Vitals Group  ?   BP 04/18/21 1414 105/75  ?   Pulse Rate 04/18/21 1414 87  ?   Resp 04/18/21 1414 16  ?   Temp 04/18/21 1414 98.3 ?F (36.8 ?C)  ?  Temp Source 04/18/21 1414 Oral  ?   SpO2 04/18/21 1414 98 %  ?   Weight --   ?   Height --   ?   Head Circumference --   ?   Peak Flow --   ?   Pain Score 04/18/21 1412 0  ?   Pain Loc --   ?   Pain Edu? --   ?   Excl. in [place]? --   ? ?No data found. ? ?Updated Vital Signs ?BP 105/75 (BP Location: Left Arm)   Pulse 87   Temp 98.3 ?F (36.8 ?C) (Oral)   Resp 16   SpO2 98%  ?   ? ?Physical Exam ?Constitutional:   ?   General: He is not in acute distress. ?   Appearance: Normal appearance. He is well-developed and normal weight. He is not ill-appearing.  ?HENT:  ?   Head: Normocephalic and atraumatic.  ?   Right Ear: Tympanic membrane and ear canal normal.  ?   Left Ear: Tympanic membrane and ear canal normal.  ?   Nose: Congestion and rhinorrhea present.  ?   Mouth/Throat:  ?   Pharynx: Posterior oropharyngeal erythema present.  ?Eyes:  ?   Conjunctiva/sclera: Conjunctivae normal.  ?    Pupils: Pupils are equal, round, and reactive to light.  ?Cardiovascular:  ?   Rate and Rhythm: Normal rate and regular rhythm.  ?   Heart sounds: Normal heart sounds.  ?Pulmonary:  ?   Effort: Pulmonary effort is normal. No respiratory distress.  ?   Breath sounds: Normal breath sounds. No wheezing or rhonchi.  ?Abdominal:  ?   General: There is no distension.  ?   Palpations: Abdomen is soft.  ?Musculoskeletal:     ?   General: Normal range of motion.  ?   Cervical back: Normal range of motion.  ?Lymphadenopathy:  ?   Cervical: No cervical adenopathy.  ?Skin: ?   General: Skin is warm and dry.  ?Neurological:  ?   Mental Status: He is alert.  ?Psychiatric:     ?   Mood and Affect: Mood normal.     ?   Behavior: Behavior normal.  ? ? ? ?UC Treatments / Results  ?Labs ?(all labs ordered are listed, but only abnormal results are displayed) ?Labs Reviewed - No data to display ? ?EKG ? ? ?Radiology ?No results found. ? ?Procedures ?Procedures (including critical care time) ? ?Medications Ordered in UC ?Medications - No data to display ? ?Initial Impression / Assessment and Plan / UC Course  ?I have reviewed the triage vital signs and the nursing notes. ? ?Pertinent labs & imaging results that were available during my care of the patient were reviewed by me and considered in my medical decision making (see chart for details). ? ?  ? ?Symptoms his symptoms have persisted for 2 weeks, and he feels like they are getting worse I will cover him with an antibiotic.  Discussed that most of these infections are viruses. ?Final Clinical Impressions(s) / UC Diagnoses  ? ?Final diagnoses:  ?LRTI (lower respiratory tract infection)  ? ? ? ?Discharge Instructions   ? ?  ?Take the Z-Pak as prescribed.  2 pills today then 1 a day until gone ?Make sure you are drinking lots of fluids ?Take the Tessalon for cough.  You may take 2 or 3 a day as needed ?See your PCP if not improving by next week ? ? ?ED Prescriptions   ? ?  Medication Sig  Dispense Auth. Provider  ? azithromycin (ZITHROMAX Z-PAK) 250 MG tablet Take 2 tablets today.  Starting tomorrow take 1 a day until gone 6 tablet Raylene Everts, MD  ? benzonatate (TESSALON) 200 MG capsule Take 1 capsule (200 mg total) by mouth 3 (three) times daily as needed for cough. 21 capsule Raylene Everts, MD  ? ?  ? ?PDMP not reviewed this encounter. ?  ?Raylene Everts, MD ?04/18/21 1444 ? ?

## 2021-04-18 NOTE — Discharge Instructions (Addendum)
Take the Z-Pak as prescribed.  2 pills today then 1 a day until gone ?Make sure you are drinking lots of fluids ?Take the Tessalon for cough.  You may take 2 or 3 a day as needed ?See your PCP if not improving by next week ?

## 2021-04-18 NOTE — ED Triage Notes (Signed)
Patient presents to Urgent Care with complaints of cough and nasal congestion since 2 weeks ago. Patient reports negative covid test. Took Sudafed for symptoms. The cough remains. Non productive cough. Denies fever or chills.   ?

## 2021-04-27 ENCOUNTER — Other Ambulatory Visit: Payer: Self-pay | Admitting: Gastroenterology

## 2021-05-06 ENCOUNTER — Ambulatory Visit: Payer: 59 | Admitting: Gastroenterology

## 2021-05-06 ENCOUNTER — Other Ambulatory Visit (INDEPENDENT_AMBULATORY_CARE_PROVIDER_SITE_OTHER): Payer: 59

## 2021-05-06 ENCOUNTER — Encounter: Payer: Self-pay | Admitting: Gastroenterology

## 2021-05-06 VITALS — BP 102/60 | HR 74 | Ht 72.0 in | Wt 182.2 lb

## 2021-05-06 DIAGNOSIS — Z86010 Personal history of colonic polyps: Secondary | ICD-10-CM

## 2021-05-06 DIAGNOSIS — K222 Esophageal obstruction: Secondary | ICD-10-CM

## 2021-05-06 DIAGNOSIS — Z7189 Other specified counseling: Secondary | ICD-10-CM

## 2021-05-06 DIAGNOSIS — K200 Eosinophilic esophagitis: Secondary | ICD-10-CM | POA: Diagnosis not present

## 2021-05-06 LAB — BASIC METABOLIC PANEL
BUN: 13 mg/dL (ref 6–23)
CO2: 27 mEq/L (ref 19–32)
Calcium: 9.4 mg/dL (ref 8.4–10.5)
Chloride: 102 mEq/L (ref 96–112)
Creatinine, Ser: 1.08 mg/dL (ref 0.40–1.50)
GFR: 82.76 mL/min (ref >60.00)
Glucose, Bld: 81 mg/dL (ref 70–99)
Potassium: 4.1 mEq/L (ref 3.5–5.1)
Sodium: 137 mEq/L (ref 135–145)

## 2021-05-06 LAB — CBC
HCT: 40.7 % (ref 39.0–52.0)
Hemoglobin: 14.1 g/dL (ref 13.0–17.0)
MCHC: 34.5 g/dL (ref 30.0–36.0)
MCV: 95.8 fl (ref 78.0–100.0)
Platelets: 235 10*3/uL (ref 150.0–400.0)
RBC: 4.25 Mil/uL (ref 4.22–5.81)
RDW: 12.7 % (ref 11.5–15.5)
WBC: 4.5 10*3/uL (ref 4.0–10.5)

## 2021-05-06 LAB — VITAMIN B12: Vitamin B-12: 171 pg/mL — ABNORMAL LOW (ref 211–911)

## 2021-05-06 MED ORDER — PANTOPRAZOLE SODIUM 20 MG PO TBEC: 20.0000 mg | DELAYED_RELEASE_TABLET | Freq: Two times a day (BID) | ORAL | 5 refills | Status: DC

## 2021-05-06 NOTE — Progress Notes (Signed)
? ?Chief Complaint:    Eosinophilic Esophagitis, medication refill ? ?GI History: 46 year old male initially seen in the GI clinic on 02/06/2019 for evaluation of solid food dysphagia.  Symptoms had been present for a number of years, but increasing in frequency/severity, vomiting/regurgitating food back out approximately 75% of the time. Rare episodes of heartburn. ?  ?EGD 02/2019 notable for EOE.  Dilated with 54 French Maloney dilator and started on high-dose PPI with plan for repeat EGD in 8 weeks. ?  ?Family history notable for father and grandfather with dysphagia as well; father also with reflux. Otherwise, no known family history of GI malignancy, IBD. ?  ?Endoscopic History: ?-EGD (2/ 12/2019, Dr. Bryan Lemma): Feline appearance and longitudinal furrows (biopsies with elevated eosinophils: 30 distal/40 proximal per hpf).  67 French Maloney dilator with mucosal rent.  Mild non-H. pylori gastritis, benign gastric polyps.  Started on high-dose PPI ?-EGD (05/06/2019, Dr. Bryan Lemma): Felinization and longitudinal furrows (biopsies: <10 eos distal, 17 eos proximal esophagus), Mild lower esophageal stenosis dilated with 20 mm TTS balloon with mucosal rent, then fractured with cold forceps.  1 cm sliding HH, 6 mm antral hyperplastic polyp, normal duodenum with biopsies negative for eosinophilic enteritis ?- Colonoscopy (02/03/2021, Dr. Bryan Lemma): 2 subcentimeter polyps (path: SSP x2), Internal hemorrhoids.  Normal TI.  Repeat in 5 years ? ? ?HPI:   ? ? ?Patient is a 46 y.o. male presenting to the Gastroenterology Clinic for follow-up and requesting refill of pantoprazole.  Last seen by me on 07/10/2019 in the office, was feeling well at that time, with resolution of dysphagia after EGD with dilation x2 and PPI.   ? ?Otherwise feeling well and without any active issues today.  No recurrence of dysphagia.  Tolerating all p.o. intake. Still taking pantoprazole ? ? ?Review of systems:     No chest pain, no SOB, no  fevers, no urinary sx  ? ?Past Medical History:  ?Diagnosis Date  ? Primary hypertension 12/19/2019  ? Trigeminal neuralgia pain 07/30/2019  ? ? ?Patient's surgical history, family medical history, social history, medications and allergies were all reviewed in Epic  ? ? ?Current Outpatient Medications  ?Medication Sig Dispense Refill  ? doxycycline (ADOXA) 50 MG tablet Take 50 mg by mouth daily.    ? lisinopril (ZESTRIL) 10 MG tablet Take 1 tablet (10 mg total) by mouth daily. 90 tablet 1  ? Omega-3 Fatty Acids (FISH OIL) 1200 MG CAPS Take by mouth.    ? pantoprazole (PROTONIX) 20 MG tablet TAKE 1 TAB BY MOUTH 2 TIMES DAILY. PLEASE CALL [phone] TO SCHEDULE AN OFFICE VISIT FOR MORE REFILLS. 60 tablet 0  ? ?No current facility-administered medications for this visit.  ? ? ?Physical Exam:   ? ? ?BP 102/60 (BP Location: Left Arm, Patient Position: Sitting, Cuff Size: Normal)   Pulse 74   Ht 6' (1.829 m)   Wt 182 lb 4 oz (82.7 kg)   SpO2 98%   BMI 24.72 kg/m?  ? ?GENERAL:  Pleasant male in NAD ?PSYCH: : Cooperative, normal affect ?Musculoskeletal:  Normal muscle tone, normal strength ?NEURO: Alert and oriented x 3, no focal neurologic deficits ? ? ?IMPRESSION and PLAN:   ? ?1) Eosinophilic Esophagitis ?2) Esophageal stricture ? ?- Reducing Protonix to 40 mg daily ?-Check BMP, iron panel, ferritin, B12 given chronic PPI use ?- Repeat EGD prn ? ?3) Medication counseling ?I have reviewed the indications, risks, and benefits of PPI therapy with the patient today. We did discuss the potential for vitamin  malabsorption, to include magnesium (very rare), calcium (easily modifiable with Calcium Citrate supplement), vitamin B12 (again, correctable with oral B12 supplement), and iron (although rarely clinically significant outside patients who require iron supplementation previously), and can monitor each of these periodically with routine labs. We have agreed to continue PPI treatment in this case. ? ?4) History of colon  polyps ?- Repeat colonoscopy in 2028 for ongoing polyp surveillance ? ?RTC in 2 years or sooner prn ? ?    ? ?Lavena Bullion ,DO, FACG 05/06/2021, 9:52 AM ? ?

## 2021-05-06 NOTE — Patient Instructions (Addendum)
If you are age 46 or older, your body mass index should be between 23-30. Your Body mass index is 24.72 kg/m?Marland Kitchen If this is out of the aforementioned range listed, please consider follow up with your Primary Care Provider. ? ?If you are age 35 or younger, your body mass index should be between 19-25. Your Body mass index is 24.72 kg/m?Marland Kitchen If this is out of the aformentioned range listed, please consider follow up with your Primary Care Provider.  ? ?________________________________________________________ ? ?The [facility] providers would like to encourage you to use [facility] to communicate with providers for non-urgent requests or questions.  Due to long hold times on the telephone, sending your provider a message by [facility] may be a faster and more efficient way to get a response.  Please allow 48 business hours for a response.  Please remember that this is for non-urgent requests.  ?_______________________________________________________ ? ?Please go to the lab on the 2nd floor [address] before you leave the office today.  ? ?We have sent the following medications to your pharmacy for you to pick up at your convenience: ?Protonix ? ?Please call us in 2 years to schedule a follow up appointment. ? ?It was a pleasure to see you today! ? ?[name], D.O. ? ? ? ? ? ?We want to thank you for trusting [facility] with your care. All of our staff and providers value the relationships we have built with our patients, and it is an honor to care for you.  ? ?We are writing to let you know that [facility] will close on May 31, 2021, and we invite you to continue to see Dr. Carmell Austria and [name] at the [facility] [place] office location. We are consolidating our serices at these [facility] practices to better provide care. Our office staff will work with you to ensure a seamless transition.  ? ?[name], DO -Dr. Bryan Lemma will be movig to [facility] at [address], [place], [place] [address], effective May 31, 2021.  Contact [phone] to schedule an appointment with him.  ? ?Carmell Austria, MD- Dr. Lyndel Safe will be movig to [facility] at [address], [place], [place] [address], effective May 31, 2021.  Contact [phone] to schedule an appointment with him.  ? ?Requesting Medical Records ?If you need to request your medical records, please follow the instructions below. Your medical records are confidential, and a copy can be transferred to another provider or released to you or another person you designate only with your permission. ? ?There are several ways to request your medical records: ?Requests for medical records can be submitted through our practice.   ?You can also request your records electronically, in your MyChart account by selecting the ?Request Health Records? tab.  ?If you need additional information on how to request records, please go to [email or web address]/, choose Patient Information, then select Request Medical Records. ?To make an appointment or if you have any questions about your health care needs, please contact our office at [phone] and one of our staff members will be glad to assist you. ?[facility] is committed to providing exceptional care for you and our community. Thank you for allowing [place] to serve your health care needs. ?Sincerely, ? ?Windy Canny, Director [facility] ?[facility] also offers convenient virtual care options. Sore throat? Sinus problems? Cold or flu symptoms? Get care from the comfort of home with [facility] Video Visits and  e-Visits. Learn more about the non-emergency conditions treated and start your virtual visit at [email or web address] ? ?

## 2021-05-07 LAB — IRON,TIBC AND FERRITIN PANEL
%SAT: 37 % (calc) (ref 20–48)
Ferritin: 174 ng/mL (ref 38–380)
Iron: 118 ug/dL (ref 50–180)
TIBC: 317 mcg/dL (calc) (ref 250–425)

## 2021-05-10 ENCOUNTER — Other Ambulatory Visit: Payer: Self-pay

## 2021-05-10 DIAGNOSIS — E538 Deficiency of other specified B group vitamins: Secondary | ICD-10-CM

## 2021-05-10 MED ORDER — VITAMIN B-12 1000 MCG PO TABS: 1000.0000 ug | ORAL_TABLET | Freq: Every day | ORAL | 2 refills | Status: AC

## 2021-07-24 ENCOUNTER — Other Ambulatory Visit: Payer: Self-pay | Admitting: Family Medicine

## 2021-07-24 DIAGNOSIS — I10 Essential (primary) hypertension: Secondary | ICD-10-CM

## 2021-07-26 ENCOUNTER — Encounter: Payer: Self-pay | Admitting: Family Medicine

## 2021-07-26 ENCOUNTER — Ambulatory Visit: Payer: 59 | Admitting: Family Medicine

## 2021-07-26 VITALS — BP 125/66 | HR 75 | Ht 72.0 in | Wt 183.0 lb

## 2021-07-26 DIAGNOSIS — E538 Deficiency of other specified B group vitamins: Secondary | ICD-10-CM | POA: Diagnosis not present

## 2021-07-26 DIAGNOSIS — Z1159 Encounter for screening for other viral diseases: Secondary | ICD-10-CM | POA: Diagnosis not present

## 2021-07-26 DIAGNOSIS — J3489 Other specified disorders of nose and nasal sinuses: Secondary | ICD-10-CM

## 2021-07-26 DIAGNOSIS — I10 Essential (primary) hypertension: Secondary | ICD-10-CM

## 2021-07-26 MED ORDER — AZITHROMYCIN 250 MG PO TABS: ORAL_TABLET | ORAL | 0 refills | Status: AC

## 2021-07-26 NOTE — Assessment & Plan Note (Signed)
Well controlled. Continue current regimen. Follow up in  6 mo  

## 2021-07-26 NOTE — Progress Notes (Signed)
Established Patient Office Visit  Subjective   Patient ID: Micheal Brewer, male    DOB: 1975/09/13  Age: 46 y.o. MRN: [id number]  Chief Complaint  Patient presents with   Hypertension    HPI  Hypertension- Pt denies chest pain, SOB, dizziness, or heart palpitations.  Taking meds as directed w/o problems.  Denies medication side effects.    He also reports that for several weeks he has had a clear runny nose with nasal drainage.  Over the weekend he started having left-sided facial pain and pressure and headache.  Started getting some color change to the mucus he started doing sinus rinses last night and then today he feels a little better in the mornings and then worse usually midday.  No fever.  Ibuprofen does help.  He has been using Sinufed and Mucinex    ROS    Objective:     BP 125/66   Pulse 75   Ht 6' (1.829 m)   Wt 183 lb (83 kg)   SpO2 96%   BMI 24.82 kg/m    Physical Exam Constitutional:      Appearance: He is well-developed.  HENT:     Head: Normocephalic and atraumatic.     Right Ear: Tympanic membrane, ear canal and external ear normal.     Left Ear: Tympanic membrane, ear canal and external ear normal.     Nose: Nose normal.     Mouth/Throat:     Pharynx: Oropharynx is clear.  Eyes:     Conjunctiva/sclera: Conjunctivae normal.     Pupils: Pupils are equal, round, and reactive to light.  Neck:     Thyroid: No thyromegaly.  Cardiovascular:     Rate and Rhythm: Normal rate and regular rhythm.     Heart sounds: Normal heart sounds.  Pulmonary:     Effort: Pulmonary effort is normal.     Breath sounds: Normal breath sounds.  Musculoskeletal:     Cervical back: Neck supple.  Lymphadenopathy:     Cervical: No cervical adenopathy.  Skin:    General: Skin is warm and dry.  Neurological:     Mental Status: He is alert and oriented to person, place, and time.  Psychiatric:        Behavior: Behavior normal.    No results found for any visits on  07/26/21.    The 10-year ASCVD risk score (Arnett DK, et al., 2019) is: 1.9%    Assessment & Plan:   Problem List Items Addressed This Visit       Cardiovascular and Mediastinum   Primary hypertension - Primary    Well controlled. Continue current regimen. Follow up in  6 mo       Relevant Orders   Lipid Panel w/reflex Direct LDL   Hepatitis C Antibody   Other Visit Diagnoses     Need for hepatitis C screening test       Relevant Orders   Hepatitis C Antibody   B12 deficiency       Relevant Orders   B12   Sinus pressure          Left Sided sinus pressure-we discussed continuing to do the nasal saline rinses if in the next day or 2 refills like symptoms or not improving in fact her continuing or getting worse then please let us know and we will consider treating for sinusitis with antibiotics  Return in about 28 weeks (around 02/07/2022) for Hypertension.    Beatrice Lecher, MD

## 2021-08-10 ENCOUNTER — Other Ambulatory Visit: Payer: Self-pay

## 2021-08-10 ENCOUNTER — Telehealth: Payer: Self-pay

## 2021-08-10 DIAGNOSIS — E538 Deficiency of other specified B group vitamins: Secondary | ICD-10-CM

## 2021-08-10 NOTE — Telephone Encounter (Signed)
Left message for pt to call back  °

## 2021-08-10 NOTE — Telephone Encounter (Signed)
-----   Message from Marice Potter, RN sent at 05/10/2021  2:27 PM EDT ----- Regarding: lab Pt due for repeat b12 lab.

## 2021-08-13 NOTE — Telephone Encounter (Signed)
PT was returning call he received. Please reach out to advise.

## 2021-08-17 NOTE — Telephone Encounter (Signed)
Spoke with pt and let him know he is due for a repeat b12 lab. Pt verbalized understanding and had no other concerns at end of call.

## 2021-09-06 ENCOUNTER — Encounter: Payer: Self-pay | Admitting: Family Medicine

## 2021-09-06 DIAGNOSIS — R222 Localized swelling, mass and lump, trunk: Secondary | ICD-10-CM

## 2021-09-06 NOTE — Telephone Encounter (Signed)
I forgot to put in.  Orders Placed This Encounter  Procedures   [place] Miscellaneo[place] Localization    Standing Status:   Future    Standing Expiration Date:   09/07/2022    Order Specific Question:   Reason for Exam (SYMPTOM  OR DIAGNOSIS REQUIRED)    Answer:   f/u abd wall mass    Order Specific Question:   Preferred imaging location?    Answer:   [facility]

## 2021-09-10 ENCOUNTER — Ambulatory Visit (INDEPENDENT_AMBULATORY_CARE_PROVIDER_SITE_OTHER): Payer: 59

## 2021-09-10 ENCOUNTER — Other Ambulatory Visit: Payer: Self-pay | Admitting: Sports Medicine

## 2021-09-10 DIAGNOSIS — R222 Localized swelling, mass and lump, trunk: Secondary | ICD-10-CM

## 2021-09-10 NOTE — Progress Notes (Signed)
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  Since it looks like 2 of the lesions have grown I would recommend having them removed as they will likely continue to grow over time.  So I think it would be perfectly reasonable to have them removed.  I am happy to refer you to general surgery for consultation for removal.  Let me know if you would like to do so.

## 2021-09-16 ENCOUNTER — Encounter: Payer: Self-pay | Admitting: Family Medicine

## 2021-09-16 ENCOUNTER — Ambulatory Visit: Payer: 59 | Admitting: Family Medicine

## 2021-09-16 VITALS — BP 110/67 | HR 66 | Ht 72.0 in | Wt 183.0 lb

## 2021-09-16 DIAGNOSIS — B079 Viral wart, unspecified: Secondary | ICD-10-CM | POA: Diagnosis not present

## 2021-09-16 DIAGNOSIS — Z23 Encounter for immunization: Secondary | ICD-10-CM

## 2021-09-16 NOTE — Progress Notes (Signed)
   Acute Office Visit  Subjective:     Patient ID: Micheal Brewer, male    DOB: [birth year], 46 y.o.   MRN: [id number]  Chief Complaint  Patient presents with   Skin Problem    HPI Patient is in today for mole that is changing on his forehead. No drainage or itching.    Flu vac given.   ROS      Objective:    BP 110/67   Pulse 66   Ht 6' (1.829 m)   Wt 183 lb (83 kg)   SpO2 98%   BMI 24.82 kg/m     Physical Exam Vitals reviewed.  Constitutional:      Appearance: He is well-developed.  HENT:     Head: Normocephalic and atraumatic.  Eyes:     Conjunctiva/sclera: Conjunctivae normal.  Cardiovascular:     Rate and Rhythm: Normal rate.  Pulmonary:     Effort: Pulmonary effort is normal.  Skin:    General: Skin is dry.     Coloration: Skin is not pale.     Comments: Right forehead with a 5 mm wart lesion   Neurological:     Mental Status: He is alert and oriented to person, place, and time.  Psychiatric:        Behavior: Behavior normal.     No results found for any visits on 09/16/21.      Assessment & Plan:   Problem List Items Addressed This Visit   None Visit Diagnoses     Viral warts, unspecified type    -  Primary   Flu vaccine need       Relevant Orders   Flu Vaccine QUAD 53moIM (Fluarix, Fluzone & Alfiuria Quad PF) (Completed)      Cryotherapy Procedure Note  Pre-operative Diagnosis: wart  Post-operative Diagnosis: same  Locations: right forehead  Indications: getting larger   Anesthesia: not required     Procedure Details  Patient informed of risks (permanent scarring, infection, light or dark discoloration, bleeding, infection, weakness, numbness and recurrence of the lesion) and benefits of the procedure and verbal informed consent obtained.  The areas are treated with liquid nitrogen therapy, frozen until ice ball extended 1 mm beyond lesion, allowed to thaw, and treated again. The patient tolerated procedure well.  The  patient was instructed on post-op care, warned that there may be blister formation, redness and pain. Recommend OTC analgesia as needed for pain.  Condition: Stable  Complications: none.  Plan: 1. Instructed to keep the area dry and covered for 24-48h and clean thereafter. 2. Warning signs of infection were reviewed.   3. Recommended that the patient use OTC acetaminophen as needed for pain.  4. PRN   No orders of the defined types were placed in this encounter.   No follow-ups on file.  CBeatrice Lecher MD

## 2021-09-16 NOTE — Patient Instructions (Signed)
Please return in 3-4 weeks if need repeat freeze

## 2021-10-07 ENCOUNTER — Encounter: Payer: Self-pay | Admitting: Family Medicine

## 2021-10-07 DIAGNOSIS — R222 Localized swelling, mass and lump, trunk: Secondary | ICD-10-CM

## 2021-10-07 NOTE — Telephone Encounter (Signed)
Referral pended.  Micheal Brewer, CMA

## 2021-10-11 LAB — HEPATITIS C ANTIBODY: Hepatitis C Ab: NONREACTIVE

## 2021-10-11 LAB — LIPID PANEL W/REFLEX DIRECT LDL
Cholesterol: 196 mg/dL (ref <200)
HDL: 45 mg/dL (ref >=40)
LDL Cholesterol (Calc): 135 mg/dL (calc) — ABNORMAL HIGH
Non-HDL Cholesterol (Calc): 151 mg/dL (calc) — ABNORMAL HIGH (ref <130)
Total CHOL/HDL Ratio: 4.4 (calc) (ref <5.0)
Triglycerides: 66 mg/dL (ref <150)

## 2021-10-11 LAB — VITAMIN B12: Vitamin B-12: 499 pg/mL (ref 200–1100)

## 2021-10-11 NOTE — Progress Notes (Signed)
Hi Sahas, LDL cholesterol jumped up compared to the last several years.  Encourage you to continue to work on healthy diet and regular exercise to bring that back down.  Your vitamin B12 actually looks good.  Okay to continue with your daily supplement.

## 2021-10-18 ENCOUNTER — Other Ambulatory Visit: Payer: Self-pay | Admitting: Surgery

## 2021-12-14 ENCOUNTER — Other Ambulatory Visit: Payer: Self-pay | Admitting: Surgery

## 2021-12-23 ENCOUNTER — Other Ambulatory Visit: Payer: Self-pay | Admitting: Surgery

## 2022-01-23 ENCOUNTER — Telehealth: Payer: Self-pay | Admitting: Family Medicine

## 2022-01-23 DIAGNOSIS — I10 Essential (primary) hypertension: Secondary | ICD-10-CM

## 2022-01-26 NOTE — Telephone Encounter (Signed)
Please call pt and advise him that he is due for f/u for bp TY!!    Called patient no answer no machine.Marland KitchenMarland Kitchen

## 2022-01-26 NOTE — Telephone Encounter (Signed)
Please call pt and advise him that he is due for f/u for bp TY!!

## 2022-02-20 ENCOUNTER — Other Ambulatory Visit: Payer: Self-pay | Admitting: Family Medicine

## 2022-02-20 DIAGNOSIS — I10 Essential (primary) hypertension: Secondary | ICD-10-CM

## 2022-02-22 NOTE — Telephone Encounter (Signed)
Call pt he is due for labs and an OV. Thank you.

## 2022-02-23 NOTE — Telephone Encounter (Signed)
Patient has an appointment for 03/08/2022 @ 8:50. He wanted to come in on a earlier date to get his labs completed if you could please put them in for him. Tvt

## 2022-03-07 ENCOUNTER — Other Ambulatory Visit: Payer: Self-pay | Admitting: Family Medicine

## 2022-03-08 ENCOUNTER — Ambulatory Visit: Payer: 59 | Admitting: Family Medicine

## 2022-03-08 ENCOUNTER — Encounter: Payer: Self-pay | Admitting: Family Medicine

## 2022-03-08 VITALS — BP 117/63 | HR 88 | Ht 72.0 in | Wt 191.0 lb

## 2022-03-08 DIAGNOSIS — E785 Hyperlipidemia, unspecified: Secondary | ICD-10-CM | POA: Insufficient documentation

## 2022-03-08 DIAGNOSIS — E782 Mixed hyperlipidemia: Secondary | ICD-10-CM

## 2022-03-08 DIAGNOSIS — I10 Essential (primary) hypertension: Secondary | ICD-10-CM

## 2022-03-08 HISTORY — DX: Hyperlipidemia, unspecified: E78.5

## 2022-03-08 LAB — BASIC METABOLIC PANEL WITH GFR
BUN: 13 mg/dL (ref 7–25)
CO2: 26 mmol/L (ref 20–32)
Calcium: 9.8 mg/dL (ref 8.6–10.3)
Chloride: 103 mmol/L (ref 98–110)
Creat: 1.06 mg/dL (ref 0.60–1.29)
Glucose, Bld: 89 mg/dL (ref 65–99)
Potassium: 4.4 mmol/L (ref 3.5–5.3)
Sodium: 138 mmol/L (ref 135–146)
eGFR: 88 mL/min/{1.73_m2} (ref >=60)

## 2022-03-08 NOTE — Assessment & Plan Note (Signed)
Well controlled. Continue current regimen. Follow up in  6 mo  

## 2022-03-08 NOTE — Progress Notes (Signed)
   Established Patient Office Visit  Subjective   Patient ID: Micheal Brewer, male    DOB: [birth year]  Age: 47 y.o. MRN: [id number]  Chief Complaint  Patient presents with   Hypertension    HPI  Hypertension- Pt denies chest pain, SOB, dizziness, or heart palpitations.  Taking meds as directed w/o problems.  Denies medication side effects.    He did want to discuss cholesterol as well. He was concerned that it was elevated.     ROS    Objective:     BP 117/63   Pulse 88   Ht 6' (1.829 m)   Wt 191 lb (86.6 kg)   SpO2 99%   BMI 25.90 kg/m    Physical Exam Constitutional:      Appearance: He is well-developed.  HENT:     Head: Normocephalic and atraumatic.  Cardiovascular:     Rate and Rhythm: Normal rate and regular rhythm.     Heart sounds: Normal heart sounds.  Pulmonary:     Effort: Pulmonary effort is normal.     Breath sounds: Normal breath sounds.  Skin:    General: Skin is warm and dry.  Neurological:     Mental Status: He is alert and oriented to person, place, and time.  Psychiatric:        Behavior: Behavior normal.     No results found for any visits on 03/08/22.    The 10-year ASCVD risk score (Arnett DK, et al., 2019) is: 2.5%    Assessment & Plan:   Problem List Items Addressed This Visit       Cardiovascular and Mediastinum   Primary hypertension - Primary    Well controlled. Continue current regimen. Follow up in  34mo      Relevant Orders   COMPLETE METABOLIC PANEL WITH GFR     Other   Hyperlipidemia    We did review his lipid numbers from the fall and encouraged him to continue to work on healthy diet and regular exercise.  Plan to recheck again in the fall.       Return in about 31 weeks (around 10/11/2022) for Hypertension and fasting Labs. .Beatrice Lecher MD

## 2022-03-08 NOTE — Progress Notes (Signed)
Your lab work is within acceptable range and there are no concerning findings.   ?

## 2022-03-08 NOTE — Assessment & Plan Note (Signed)
We did review his lipid numbers from the fall and encouraged him to continue to work on healthy diet and regular exercise.  Plan to recheck again in the fall.

## 2022-03-09 LAB — COMPLETE METABOLIC PANEL WITH GFR
AG Ratio: 1.8 (calc) (ref 1.0–2.5)
ALT: 13 U/L (ref 9–46)
AST: 24 U/L (ref 10–40)
Albumin: 4.4 g/dL (ref 3.6–5.1)
Alkaline phosphatase (APISO): 61 U/L (ref 36–130)
BUN: 15 mg/dL (ref 7–25)
CO2: 29 mmol/L (ref 20–32)
Calcium: 9.8 mg/dL (ref 8.6–10.3)
Chloride: 103 mmol/L (ref 98–110)
Creat: 1.02 mg/dL (ref 0.60–1.29)
Globulin: 2.4 g/dL (calc) (ref 1.9–3.7)
Glucose, Bld: 72 mg/dL (ref 65–99)
Potassium: 4.3 mmol/L (ref 3.5–5.3)
Sodium: 139 mmol/L (ref 135–146)
Total Bilirubin: 0.5 mg/dL (ref 0.2–1.2)
Total Protein: 6.8 g/dL (ref 6.1–8.1)
eGFR: 92 mL/min/{1.73_m2} (ref >=60)

## 2022-03-09 NOTE — Progress Notes (Signed)
Your lab work is within acceptable range and there are no concerning findings.   ?

## 2022-03-25 ENCOUNTER — Other Ambulatory Visit: Payer: Self-pay | Admitting: Family Medicine

## 2022-03-25 DIAGNOSIS — I10 Essential (primary) hypertension: Secondary | ICD-10-CM

## 2022-06-08 ENCOUNTER — Ambulatory Visit
Admission: EM | Admit: 2022-06-08 | Discharge: 2022-06-08 | Disposition: A | Discharge status: Home / Self Care | Payer: 59 | Attending: Family Medicine | Admitting: Family Medicine

## 2022-06-08 DIAGNOSIS — W57XXXA Bitten or stung by nonvenomous insect and other nonvenomous arthropods, initial encounter: Secondary | ICD-10-CM

## 2022-06-08 DIAGNOSIS — S80862A Insect bite (nonvenomous), left lower leg, initial encounter: Secondary | ICD-10-CM

## 2022-06-08 DIAGNOSIS — L089 Local infection of the skin and subcutaneous tissue, unspecified: Secondary | ICD-10-CM | POA: Diagnosis not present

## 2022-06-08 DIAGNOSIS — L03116 Cellulitis of left lower limb: Secondary | ICD-10-CM

## 2022-06-08 MED ORDER — DOXYCYCLINE HYCLATE 100 MG PO CAPS: 100.0000 mg | ORAL_CAPSULE | Freq: Two times a day (BID) | ORAL | 0 refills | Status: AC

## 2022-06-08 NOTE — ED Provider Notes (Signed)
[facility]    CSN: [id number] Arrival date & time: 06/08/22  0807      History   Chief Complaint Chief Complaint  Patient presents with   Wound Check    LT leg    HPI DAMARR BLASKE is a 47 y.o. male.   HPI 47 year old male presents with possible spider/insect bite to his left lower leg on Monday.  Reports swelling and redness and itchiness spreading from site currently.  PMH significant for obesity, primary hypertension, and trigeminal neuralgia pain.  Past Medical History:  Diagnosis Date   Hyperlipidemia 03/08/2022   Primary hypertension 12/19/2019   Trigeminal neuralgia pain 07/30/2019    Patient Active Problem List   Diagnosis Date Noted   Hyperlipidemia 03/08/2022   Primary hypertension 12/19/2019   Facial pain 07/30/2019   Trigeminal neuralgia pain 07/30/2019   Pupil asymmetry 07/30/2019   Dysphagia 01/28/2019   Closed nondisplaced fracture of body of right hamate bone 03/11/2013    Past Surgical History:  Procedure Laterality Date   UPPER GASTROINTESTINAL ENDOSCOPY     02-2019, 04-2019       Home Medications    Prior to Admission medications   Medication Sig Start Date End Date Taking? Authorizing Provider  doxycycline (VIBRAMYCIN) 100 MG capsule Take 1 capsule (100 mg total) by mouth 2 (two) times daily for 10 days. 06/08/22 06/18/22 Yes Trevor Iha, FNP  lisinopril (ZESTRIL) 10 MG tablet Take 1 tablet (10 mg total) by mouth daily. 03/25/22   Agapito Games, MD  Omega-3 Fatty Acids (FISH OIL) 1200 MG CAPS Take by mouth.    [provider]  pantoprazole (PROTONIX) 20 MG tablet Take 1 tablet (20 mg total) by mouth 2 (two) times daily. Patient taking differently: Take 20 mg by mouth daily. 05/06/21   Cirigliano, Vito V, DO  vitamin B-12 (CYANOCOBALAMIN) 1000 MCG tablet Take 1 tablet (1,000 mcg total) by mouth daily. 05/10/21   Cirigliano, Verlin Dike, DO    Family History Family History  Problem Relation Age of Onset   Heart  failure Mother    Hypertension Brother    Hypertension Father    Colon polyps Father    Colon cancer Paternal Uncle        great uncle    Prostate cancer Paternal Grandfather    Esophageal cancer Neg Hx    Rectal cancer Neg Hx    Stomach cancer Neg Hx     Social History Social History   Tobacco Use   Smoking status: Never   Smokeless tobacco: Never  Vaping Use   Vaping Use: Never used  Substance Use Topics   Alcohol use: Yes    Alcohol/week: 6.0 - 8.0 standard drinks of alcohol    Types: 6 - 8 Standard drinks or equivalent per week   Drug use: No     Allergies   Penicillins   Review of Systems Review of Systems  Skin:  Positive for rash.  All other systems reviewed and are negative.    Physical Exam Triage Vital Signs ED Triage Vitals  Enc Vitals Group     BP 06/08/22 0827 133/82     Pulse Rate 06/08/22 0827 74     Resp 06/08/22 0827 17     Temp 06/08/22 0827 98.1 F (36.7 C)     Temp Source 06/08/22 0827 Oral     SpO2 06/08/22 0827 99 %     Weight --      Height --  Head Circumference --      Peak Flow --      Pain Score 06/08/22 0828 0     Pain Loc --      Pain Edu? --      Excl. in GC? --    No data found.  Updated Vital Signs BP 133/82 (BP Location: Left Arm)   Pulse 74   Temp 98.1 F (36.7 C) (Oral)   Resp 17   SpO2 99%       Physical Exam Vitals and nursing note reviewed.  Constitutional:      Appearance: Normal appearance. He is normal weight.  HENT:     Head: Normocephalic and atraumatic.     Mouth/Throat:     Mouth: Mucous membranes are moist.     Pharynx: Oropharynx is clear.  Eyes:     Extraocular Movements: Extraocular movements intact.     Conjunctiva/sclera: Conjunctivae normal.     Pupils: Pupils are equal, round, and reactive to light.  Cardiovascular:     Rate and Rhythm: Normal rate and regular rhythm.     Pulses: Normal pulses.     Heart sounds: Normal heart sounds.  Pulmonary:     Effort: Pulmonary effort  is normal.     Breath sounds: No wheezing, rhonchi or rales.  Musculoskeletal:        General: Normal range of motion.     Cervical back: Normal range of motion and neck supple.  Skin:    General: Skin is warm and dry.     Comments: Left lower leg (superior medial aspect): Central insect puncture bite with surrounding erythema noted-please see image below  Neurological:     General: No focal deficit present.     Mental Status: He is alert and oriented to person, place, and time. Mental status is at baseline.      UC Treatments / Results  Labs (all labs ordered are listed, but only abnormal results are displayed) Labs Reviewed - No data to display  EKG   Radiology No results found.  Procedures Procedures (including critical care time)  Medications Ordered in UC Medications - No data to display  Initial Impression / Assessment and Plan / UC Course  I have reviewed the triage vital signs and the nursing notes.  Pertinent labs & imaging results that were available during my care of the patient were reviewed by me and considered in my medical decision making (see chart for details).     MDM: 1.  Cellulitis of left lower leg-Rx'd doxycycline 100 mg capsule twice daily x 10 days; 2.  Insect bite of left lower leg with infection, initial encounter-same as 1 Rx'd doxycycline 100 mg capsule twice daily x 10 days. Instructed patient to take medication as directed with food to completion.  Advised patient to discontinue 50 mg dose of doxycycline while taking doxycycline 100 mg capsule twice daily for the next 10 days.  Encouraged increase daily water intake to 64 ounces per day while taking this medication.  Advised if symptoms worsen and/or unresolved please follow-up with PCP or here for further evaluation. Final Clinical Impressions(s) / UC Diagnoses   Final diagnoses:  Cellulitis of left lower leg  Insect bite of left lower leg with infection, initial encounter     Discharge  Instructions      Instructed patient to take medication as directed with food to completion.  Advised patient to discontinue 50 mg dose of doxycycline while taking doxycycline 100 mg capsule twice  daily for the next 10 days.  Encouraged increase daily water intake to 64 ounces per day while taking this medication.  Advised if symptoms worsen and/or unresolved please follow-up with PCP or here for further evaluation.     ED Prescriptions     Medication Sig Dispense Auth. Provider   doxycycline (VIBRAMYCIN) 100 MG capsule Take 1 capsule (100 mg total) by mouth 2 (two) times daily for 10 days. 20 capsule Trevor Iha, FNP      PDMP not reviewed this encounter.   Trevor Iha, FNP 06/08/22 [phone]

## 2022-06-08 NOTE — ED Triage Notes (Signed)
Pt c/o wound or possible spider bite to LT leg on Monday. Itchy. Swelling and redness spreading.

## 2022-06-08 NOTE — Discharge Instructions (Addendum)
Instructed patient to take medication as directed with food to completion.  Advised patient to discontinue 50 mg dose of doxycycline while taking doxycycline 100 mg capsule twice daily for the next 10 days.  Encouraged increase daily water intake to 64 ounces per day while taking this medication.  Advised if symptoms worsen and/or unresolved please follow-up with PCP or here for further evaluation.

## 2022-09-07 NOTE — Progress Notes (Unsigned)
09/08/2022 Micheal Brewer [id number] 05-26-1975  Referring provider: Agapito Games, * Primary GI doctor: Dr. Barron Alvine  ASSESSMENT AND PLAN:   Eosinophilic esophagitis with history of esophageal stricture dilated x 2 in 2021 As well-controlled on pantoprazole 20 mg once daily Has had 3 episodes of some mild dysphagia to chicken in the last 6 months No worsening GERD, no change in diet, no NSAIDs Discussed different therapeutic options for EOE therapy including empiric six/two food elimination diet, PPI therapy, steroid therapy and Dupixent. After discussion, we have agreed to try monitoring his diet and increasing pantoprazole 20 mg to twice daily for 6 weeks and ensuring that he is taking it 30 minutes to an hour before food. Patient declined repeat endoscopy at this time wants to try increasing Protonix and taking it properly Follow-up in 6 months with Dr. Barron Alvine for evaluation can decide at that time if EGD is warranted or trial of fluticasone  History of colonic polyps Recall 01/2026  Vitamin B12 deficiency Checked 11 months ago was 455 Continue on B12 1000 mcg daily Can recheck B12 55-month follow-up    Patient Care Team: Agapito Games, MD as PCP - General  HISTORY OF PRESENT ILLNESS: 47 y.o. male with a past medical history of hypertension, trigeminal neuralgia, hyperlipidemia and others listed below presents for evaluation of med refill.   EGD 02/2019 notable for EOE.  Dilated x 2 with resolution of symptoms, maintained on PPI. 02/03/2021 Colon recall 01/2026 05/06/2021 last seen in the office with Dr. Barron Alvine for medication refill of pantoprazole for EOE Found to have B12 deficiency 171 started on 100 mcg daily.  He is on pantoprazole, 20 mg once a day, with food in the morning.  He is on B12 pill 1000 mcg daily.  He states in the last 3 months he has had 2-3 times with chicken that it has got hung up, very rare.  Denies GERD, LPR  symptoms, nausea, vomiting.   He denies blood thinner use.  He denies NSAID use.  He reports ETOH use, 2-3 nightly on the weekend.  He denies tobacco use.  He denies drug use.    Endoscopic History: -EGD (2/ 12/2019, Dr. Barron Alvine): Feline appearance and longitudinal furrows (biopsies with elevated eosinophils: 30 distal/40 proximal per hpf).  54 French Maloney dilator with mucosal rent.  Mild non-H. pylori gastritis, benign gastric polyps.  Started on high-dose PPI -EGD (05/06/2019, Dr. Barron Alvine): Felinization and longitudinal furrows (biopsies: <10 eos distal, 17 eos proximal esophagus), Mild lower esophageal stenosis dilated with 20 mm TTS balloon with mucosal rent, then fractured with cold forceps.  1 cm sliding HH, 6 mm antral hyperplastic polyp, normal duodenum with biopsies negative for eosinophilic enteritis - Colonoscopy (02/03/2021, Dr. Barron Alvine): 2 subcentimeter polyps (path: SSP x2), Internal hemorrhoids.  Normal TI.  Repeat in 5 years  He  reports that he has never smoked. He has never used smokeless tobacco. He reports current alcohol use of about 6.0 - 8.0 standard drinks of alcohol per week. He reports that he does not use drugs.  RELEVANT LABS AND IMAGING: CBC    Component Value Date/Time   WBC 4.5 05/06/2021 1018   RBC 4.25 05/06/2021 1018   HGB 14.1 05/06/2021 1018   HCT 40.7 05/06/2021 1018   PLT 235.0 05/06/2021 1018   MCV 95.8 05/06/2021 1018   MCH 34.2 (H) 01/28/2019 0822   MCHC 34.5 05/06/2021 1018   RDW 12.7 05/06/2021 1018   No results for input(s): "HGB"  in the last 8760 hours.  CMP     Component Value Date/Time   NA 139 03/08/2022 0924   K 4.3 03/08/2022 0924   CL 103 03/08/2022 0924   CO2 29 03/08/2022 0924   GLUCOSE 72 03/08/2022 0924   BUN 15 03/08/2022 0924   CREATININE 1.02 03/08/2022 0924   CALCIUM 9.8 03/08/2022 0924   PROT 6.8 03/08/2022 0924   ALBUMIN 4.4 10/03/2011 0843   AST 24 03/08/2022 0924   ALT 13 03/08/2022 0924   ALKPHOS  62 10/03/2011 0843   BILITOT 0.5 03/08/2022 0924   GFRNONAA 77 07/23/2020 0000   GFRAA 89 07/23/2020 0000      Latest Ref Rng & Units 03/08/2022    9:24 AM 07/23/2020   12:00 AM 01/28/2019    8:22 AM  Hepatic Function  Total Protein 6.1 - 8.1 g/dL 6.8  6.8  6.8   AST 10 - 40 U/L 24  21  18    ALT 9 - 46 U/L 13  15  11    Total Bilirubin 0.2 - 1.2 mg/dL 0.5  1.0  0.9       Current Medications:    Current Outpatient Medications (Cardiovascular):    lisinopril (ZESTRIL) 10 MG tablet, Take 1 tablet (10 mg total) by mouth daily.    Current Outpatient Medications (Hematological):    vitamin B-12 (CYANOCOBALAMIN) 1000 MCG tablet, Take 1 tablet (1,000 mcg total) by mouth daily.  Current Outpatient Medications (Other):    Omega-3 Fatty Acids (FISH OIL) 1200 MG CAPS, Take by mouth.   pantoprazole (PROTONIX) 20 MG tablet, Take 1 tablet (20 mg total) by mouth 2 (two) times daily. (Patient taking differently: Take 20 mg by mouth daily.)  Medical History:  Past Medical History:  Diagnosis Date   Hyperlipidemia 03/08/2022   Primary hypertension 12/19/2019   Trigeminal neuralgia pain 07/30/2019   Allergies:  Allergies  Allergen Reactions   Penicillins Rash    REACTION: rash     Surgical History:  He  has a past surgical history that includes Upper gastrointestinal endoscopy. Family History:  His family history includes Colon cancer in his paternal uncle; Colon polyps in his father; Heart failure in his mother; Hypertension in his brother and father; Prostate cancer in his paternal grandfather.  REVIEW OF SYSTEMS  : All other systems reviewed and negative except where noted in the History of Present Illness.  PHYSICAL EXAM: BP 120/76   Pulse 61   Ht 6' (1.829 m)   Wt 187 lb (84.8 kg)   BMI 25.36 kg/m  General Appearance: Well nourished, in no apparent distress. Head:   Normocephalic and atraumatic. Eyes:  sclerae anicteric,conjunctive pink  Respiratory: Respiratory effort  normal, BS equal bilaterally without rales, rhonchi, wheezing. Cardio: RRR with no MRGs. Peripheral pulses intact.  Abdomen: Soft,  Obese ,active bowel sounds. No tenderness . No masses. Rectal: Not evaluated Musculoskeletal: Full ROM, Normal gait. Without edema. Skin:  Dry and intact without significant lesions or rashes Neuro: Alert and  oriented x4;  No focal deficits. Psych:  Cooperative. Normal mood and affect.    Doree Albee, PA-C 9:28 AM

## 2022-09-08 ENCOUNTER — Encounter: Payer: Self-pay | Admitting: Physician Assistant

## 2022-09-08 ENCOUNTER — Ambulatory Visit: Payer: 59 | Admitting: Physician Assistant

## 2022-09-08 VITALS — BP 120/76 | HR 61 | Ht 72.0 in | Wt 187.0 lb

## 2022-09-08 DIAGNOSIS — K222 Esophageal obstruction: Secondary | ICD-10-CM | POA: Diagnosis not present

## 2022-09-08 DIAGNOSIS — K640 First degree hemorrhoids: Secondary | ICD-10-CM

## 2022-09-08 DIAGNOSIS — K200 Eosinophilic esophagitis: Secondary | ICD-10-CM

## 2022-09-08 DIAGNOSIS — Z86010 Personal history of colonic polyps: Secondary | ICD-10-CM

## 2022-09-08 DIAGNOSIS — E538 Deficiency of other specified B group vitamins: Secondary | ICD-10-CM

## 2022-09-08 NOTE — Patient Instructions (Addendum)
Please call to get [place] a follow up with dr Barron Alvine in 6 months  Discussed different therapeutic options for EOE therapy including empiric six/two food elimination diet, PPI therapy, steroid therapy and Dupixent. After discussion, we have agreed to try to increase pantoprazole  the empiric elimination diet (dairy, wheat, eggs, soy, peanuts and tree nuts, and fish and shellfish), and/or eliminating foods based on allergy testing.   I recommend eliminated dairy and  gluten-containing grains from the diet for 6 weeks.   Increase the protonix to twice a day for 6-8 weeks, then go back down to the 20 mg daily.  Please take this medication 30 minutes to 1 hour before meals- this makes it more effective.  Avoid spicy and acidic foods Avoid fatty foods Limit your intake of coffee, tea, alcohol, and carbonated drinks Work to maintain a healthy weight Keep the head of the bed elevated at least 3 inches with blocks or a wedge pillow if you are having any nighttime symptoms Stay upright for 2 hours after eating Avoid meals and snacks three to four hours before bedtime  _______________________________________________________  If your blood pressure at your visit was 140/90 or greater, please contact your primary care physician to follow up on this.  _______________________________________________________  If you are age 47 or older, your body mass index should be between 23-30. Your Body mass index is 25.36 kg/m. If this is out of the aforementioned range listed, please consider follow up with your Primary Care Provider.  If you are age 82 or younger, your body mass index should be between 19-25. Your Body mass index is 25.36 kg/m. If this is out of the aformentioned range listed, please consider follow up with your Primary Care Provider.   ________________________________________________________  The [facility] providers would like to encourage you to use [facility] to communicate with providers  for non-urgent requests or questions.  Due to long hold times on the telephone, sending your provider a message by [facility] may be a faster and more efficient way to get a response.  Please allow 48 business hours for a response.  Please remember that this is for non-urgent requests.  _______________________________________________________ It was a pleasure to see you today!  Thank you for trusting me with your gastrointestinal care!

## 2022-09-09 NOTE — Progress Notes (Signed)
Agree with the assessment and plan as outlined by Amanda Collier, PA-C. ? ?Vito Cirigliano, DO, FACG ? ?

## 2022-09-13 ENCOUNTER — Other Ambulatory Visit: Payer: Self-pay | Admitting: Gastroenterology

## 2022-10-02 ENCOUNTER — Other Ambulatory Visit: Payer: Self-pay | Admitting: Family Medicine

## 2022-10-02 DIAGNOSIS — I10 Essential (primary) hypertension: Secondary | ICD-10-CM

## 2022-10-10 ENCOUNTER — Encounter: Payer: Self-pay | Admitting: Family Medicine

## 2022-10-10 ENCOUNTER — Ambulatory Visit: Payer: 59 | Admitting: Family Medicine

## 2022-10-10 VITALS — BP 109/56 | HR 64 | Ht 72.0 in | Wt 187.0 lb

## 2022-10-10 DIAGNOSIS — R1310 Dysphagia, unspecified: Secondary | ICD-10-CM

## 2022-10-10 DIAGNOSIS — Z23 Encounter for immunization: Secondary | ICD-10-CM

## 2022-10-10 DIAGNOSIS — K200 Eosinophilic esophagitis: Secondary | ICD-10-CM | POA: Diagnosis not present

## 2022-10-10 DIAGNOSIS — I10 Essential (primary) hypertension: Secondary | ICD-10-CM

## 2022-10-10 DIAGNOSIS — E782 Mixed hyperlipidemia: Secondary | ICD-10-CM | POA: Diagnosis not present

## 2022-10-10 DIAGNOSIS — D492 Neoplasm of unspecified behavior of bone, soft tissue, and skin: Secondary | ICD-10-CM

## 2022-10-10 NOTE — Assessment & Plan Note (Signed)
Now on PPI and doing well.  Follows with Dr. Barron Alvine.

## 2022-10-10 NOTE — Assessment & Plan Note (Signed)
Well controlled. Continue current regimen. Follow up in  6 mo due for labs

## 2022-10-10 NOTE — Progress Notes (Addendum)
   Established Patient Office Visit  Subjective   Patient ID: Micheal Brewer, male    DOB: 28-Apr-1975  Age: 47 y.o. MRN: [id number]  Chief Complaint  Patient presents with   Hypertension    HPI  Hypertension- Pt denies chest pain, SOB, dizziness, or heart palpitations.  Taking meds as directed w/o problems.  Denies medication side effects.   Has a new pink raised lesion on right facial cheek. No pain or itching.     ROS    Objective:     BP (!) 109/56   Pulse 64   Ht 6' (1.829 m)   Wt 187 lb (84.8 kg)   SpO2 100%   BMI 25.36 kg/m    Physical Exam Vitals and nursing note reviewed.  Constitutional:      Appearance: Normal appearance.  HENT:     Head: Normocephalic and atraumatic.  Eyes:     Conjunctiva/sclera: Conjunctivae normal.  Cardiovascular:     Rate and Rhythm: Normal rate and regular rhythm.  Pulmonary:     Effort: Pulmonary effort is normal.     Breath sounds: Normal breath sounds.  Skin:    General: Skin is warm and dry.     Comments: Small 2 mm pink shiny round raised lesion with a few blood vessels on the surface.   Neurological:     Mental Status: He is alert.  Psychiatric:        Mood and Affect: Mood normal.      No results found for any visits on 10/10/22.    The 10-year ASCVD risk score (Arnett DK, et al., 2019) is: 2.4%    Assessment & Plan:   Problem List Items Addressed This Visit       Cardiovascular and Mediastinum   Primary hypertension - Primary    Well controlled. Continue current regimen. Follow up in  6 mo due for labs       Relevant Orders   CMP14+EGFR   Lipid panel   CBC     Digestive   Eosinophilic esophagitis   Dysphagia    Now on PPI and doing well.  Follows with Dr. Barron Alvine.         Other   Hyperlipidemia    Due to recheck lipids.       Relevant Orders   CMP14+EGFR   Lipid panel   CBC   Other Visit Diagnoses     Encounter for immunization       Relevant Orders   Flu vaccine trivalent  PF, 6mos and older(Flulaval,Afluria,Fluarix,Fluzone) (Completed)   Abnormal skin growth       Relevant Orders   Ambulatory referral to Dermatology       Refer to Dermatology    Return in about 27 weeks (around 04/17/2023) for Hypertension.    Nani Gasser, MD

## 2022-10-10 NOTE — Assessment & Plan Note (Signed)
Due to recheck lipids.

## 2022-10-11 LAB — CBC
Hematocrit: 43.4 % (ref 37.5–51.0)
Hemoglobin: 14.7 g/dL (ref 13.0–17.7)
MCH: 33.3 pg — ABNORMAL HIGH (ref 26.6–33.0)
MCHC: 33.9 g/dL (ref 31.5–35.7)
MCV: 98 fL — ABNORMAL HIGH (ref 79–97)
Platelets: 225 10*3/uL (ref 150–450)
RBC: 4.42 x10E6/uL (ref 4.14–5.80)
RDW: 12 % (ref 11.6–15.4)
WBC: 4.3 10*3/uL (ref 3.4–10.8)

## 2022-10-11 LAB — CMP14+EGFR
ALT: 13 IU/L (ref 0–44)
AST: 22 IU/L (ref 0–40)
Albumin: 4.5 g/dL (ref 4.1–5.1)
Alkaline Phosphatase: 70 IU/L (ref 44–121)
BUN/Creatinine Ratio: 15 (ref 9–20)
BUN: 16 mg/dL (ref 6–24)
Bilirubin Total: 0.4 mg/dL (ref 0.0–1.2)
CO2: 22 mmol/L (ref 20–29)
Calcium: 9.7 mg/dL (ref 8.7–10.2)
Chloride: 104 mmol/L (ref 96–106)
Creatinine, Ser: 1.07 mg/dL (ref 0.76–1.27)
Globulin, Total: 2.3 g/dL (ref 1.5–4.5)
Glucose: 91 mg/dL (ref 70–99)
Potassium: 4.4 mmol/L (ref 3.5–5.2)
Sodium: 140 mmol/L (ref 134–144)
Total Protein: 6.8 g/dL (ref 6.0–8.5)
eGFR: 86 mL/min/{1.73_m2} (ref >59)

## 2022-10-11 LAB — LIPID PANEL
Chol/HDL Ratio: 4 ratio (ref 0.0–5.0)
Cholesterol, Total: 190 mg/dL (ref 100–199)
HDL: 47 mg/dL (ref >39)
LDL Chol Calc (NIH): 125 mg/dL — ABNORMAL HIGH (ref 0–99)
Triglycerides: 98 mg/dL (ref 0–149)
VLDL Cholesterol Cal: 18 mg/dL (ref 5–40)

## 2022-10-11 NOTE — Progress Notes (Signed)
Hi Link, Your LDL cholesterol is mildly elevated at 125 goal is under 100.  No anemia.  Red blood cells are just a little bit on the larger side this can come from low B12 so we will call the lab and see if we can add that on.  Your metabolic panel looks great.  Please call lab and see if we can add a B12 and folate level.

## 2022-11-22 ENCOUNTER — Encounter: Payer: Self-pay | Admitting: Family Medicine

## 2022-12-05 ENCOUNTER — Other Ambulatory Visit: Payer: Self-pay | Admitting: Gastroenterology

## 2022-12-05 ENCOUNTER — Other Ambulatory Visit: Payer: Self-pay

## 2022-12-05 ENCOUNTER — Telehealth: Payer: Self-pay | Admitting: Physician Assistant

## 2022-12-05 MED ORDER — PANTOPRAZOLE SODIUM 20 MG PO TBEC: 20.0000 mg | DELAYED_RELEASE_TABLET | Freq: Every day | ORAL | 0 refills | Status: DC

## 2022-12-05 NOTE — Telephone Encounter (Signed)
Patient called and stated that he needs a refill on pantoprazole 20 mg. Also stated that when he called the pharmacy to refill the medication it was denied. Patient is requesting a call back. Please advise.

## 2022-12-08 ENCOUNTER — Other Ambulatory Visit: Payer: Self-pay

## 2022-12-08 MED ORDER — PANTOPRAZOLE SODIUM 20 MG PO TBEC: 20.0000 mg | DELAYED_RELEASE_TABLET | Freq: Every day | ORAL | 2 refills | Status: DC

## 2022-12-08 NOTE — Telephone Encounter (Signed)
Patient called and stated the he received the refill prescription for Protonix but only received 10 pills and he is suppose to take them every day but its not enough to last him for his next appointment. Patient is requesting a call back . Please advise.

## 2023-01-19 ENCOUNTER — Ambulatory Visit
Admission: RE | Admit: 2023-01-19 | Discharge: 2023-01-19 | Disposition: A | Discharge status: Home / Self Care | Payer: 59 | Source: Ambulatory Visit | Attending: Family Medicine | Admitting: Family Medicine

## 2023-01-19 VITALS — BP 137/95 | HR 87 | Temp 98.1°F | Resp 17

## 2023-01-19 DIAGNOSIS — R0981 Nasal congestion: Secondary | ICD-10-CM

## 2023-01-19 DIAGNOSIS — U071 COVID-19: Secondary | ICD-10-CM | POA: Diagnosis not present

## 2023-01-19 DIAGNOSIS — J3489 Other specified disorders of nose and nasal sinuses: Secondary | ICD-10-CM | POA: Diagnosis not present

## 2023-01-19 MED ORDER — PREDNISONE 10 MG (21) PO TBPK: ORAL_TABLET | Freq: Every day | ORAL | 0 refills | Status: DC

## 2023-01-19 NOTE — Discharge Instructions (Addendum)
 Advised patient to take medications as directed with food to completion.  Encouraged to increase daily water intake to 64 ounces per day while taking this medication.  Advised if symptoms worsen and/or unresolved please follow-up with PCP or here for further evaluation.

## 2023-01-19 NOTE — ED Triage Notes (Signed)
 Pt c/o facial pain and LT ear pressure x 2 days. York Spaniel he was pos for COVID 12/28, facial pain worsening in last couple days. Denies fever in last 24 hours. No OTC meds taken.

## 2023-01-19 NOTE — ED Provider Notes (Signed)
 Micheal Brewer    CSN: [id number] Arrival date & time: 01/19/23  0936      History   Chief Complaint Chief Complaint  Patient presents with   Facial Pain    COVID pos 12/28    HPI Micheal Brewer is a 48 y.o. male.   HPI 48 year old male presents with left facial pain and left ear pressure for 2 days.  Reports positive for COVID-19 on 01/14/23.  Reports facial pain worsening over the past 3 days.  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 Authorizing Provider  predniSONE  (STERAPRED UNI-PAK 21 TAB) 10 MG (21) TBPK tablet Take by mouth daily. Take 6 tabs by mouth daily  for 2 days, then 5 tabs for 2 days, then 4 tabs for 2 days, then 3 tabs for 2 days, 2 tabs for 2 days, then 1 tab by mouth daily for 2 days 01/19/23  Yes Teddy Sharper, FNP  doxycycline  (MONODOX ) 50 MG capsule Take 50 mg by mouth daily. 10/01/22     lisinopril  (ZESTRIL ) 10 MG tablet TAKE 1 TABLET BY MOUTH EVERY DAY 10/03/22   [name] Dorothyann BIRCH, MD  pantoprazole  (PROTONIX ) 20 MG tablet Take 1 tablet (20 mg total) by mouth daily. Please call [phone] to schedule an office visit for more refills 12/08/22   Craig Alan SAUNDERS, PA-C  vitamin B-12 (CYANOCOBALAMIN) 1000 MCG tablet Take 1 tablet (1,000 mcg total) by mouth daily. 05/10/21   Cirigliano, Sandor GAILS,  DO    Family History Family History  Problem Relation Age of Onset   Heart failure Mother    Hypertension Brother    Hypertension Father    Colon polyps Father    Colon cancer Paternal Uncle        great uncle    Prostate cancer Paternal Grandfather    Esophageal cancer Neg Hx    Rectal cancer Neg Hx    Stomach cancer Neg Hx     Social History Social History   Tobacco Use   Smoking status: Never   Smokeless tobacco: Never  Vaping Use   Vaping status: Never Used  Substance Use Topics   Alcohol use: Yes    Alcohol/week: 6.0 - 8.0 standard drinks of alcohol    Types: 6 - 8 Standard drinks or equivalent per week   Drug use: No     Allergies   Penicillins   Review of Systems Review of Systems   Physical Exam Triage Vital Signs ED Triage Vitals [01/19/23 0947]  Encounter Vitals Group     BP (!) 137/95     Systolic BP Percentile      Diastolic BP Percentile      Pulse Rate 87     Resp 17     Temp 98.1 F (36.7 C)     Temp Source  Oral     SpO2 97 %     Weight      Height      Head Circumference      Peak Flow      Pain Score 4     Pain Loc      Pain Education      Exclude from Growth Chart    No data found.  Updated Vital Signs BP (!) 137/95 (BP Location: Left Arm)   Pulse 87   Temp 98.1 F (36.7 C) (Oral)   Resp 17   SpO2 97%   Visual Acuity Right Eye Distance:   Left Eye Distance:   Bilateral Distance:    Right Eye Near:   Left Eye Near:    Bilateral Near:     Physical Exam Vitals and nursing note reviewed.  Constitutional:      Appearance: Normal appearance. He is normal weight.  HENT:     Head: Normocephalic and atraumatic.     Right Ear: Tympanic membrane and external ear normal.     Left Ear: Tympanic membrane and external ear normal.     Ears:     Comments: Significant eustachian tube dysfunction noted bilaterally    Mouth/Throat:     Mouth: Mucous membranes are moist.     Pharynx: Oropharynx is clear.  Eyes:      Extraocular Movements: Extraocular movements intact.     Conjunctiva/sclera: Conjunctivae normal.     Pupils: Pupils are equal, round, and reactive to light.  Cardiovascular:     Rate and Rhythm: Normal rate and regular rhythm.     Pulses: Normal pulses.     Heart sounds: Normal heart sounds.  Pulmonary:     Effort: Pulmonary effort is normal.     Breath sounds: Normal breath sounds. No wheezing, rhonchi or rales.  Musculoskeletal:        General: Normal range of motion.     Cervical back: Normal range of motion and neck supple. No tenderness.  Lymphadenopathy:     Cervical: No cervical adenopathy.  Skin:    General: Skin is warm and dry.  Neurological:     General: No focal deficit present.     Mental Status: He is alert and oriented to person, place, and time. Mental status is at baseline.  Psychiatric:        Mood and Affect: Mood normal.        Behavior: Behavior normal.      UC Treatments / Results  Labs (all labs ordered are listed, but only abnormal results are displayed) Labs Reviewed - No data to display  EKG   Radiology No results found.  Procedures Procedures (including critical Brewer time)  Medications Ordered in UC Medications - No data to display  Initial Impression / Assessment and Plan / UC Course  I have reviewed the triage vital signs and the nursing notes.  Pertinent labs & imaging results that were available during my Brewer of the patient were reviewed by me and considered in my medical decision making (see chart for details).     MDM: 1.  Congestion of nasal sinus-Rx'd Sterapred Unipak (tapering from 60 mg to 10 mg over 10 days); 2.  Sinus pressure-Rx'd Sterapred Unipak (tapering from 60 mg to 10 mg over 10 days); 3.  COVID-19-patient reports testing positive on 01/14/2023 patient is currently not taking Paxlovid. Advised patient to take medications as directed with food to completion.  Encouraged to increase daily water intake to  64 ounces per  day while taking this medication.  Advised if symptoms worsen and/or unresolved please follow-up with PCP or here for further evaluation.  Patient discharged home, hemodynamically stable. Final Clinical Impressions(s) / UC Diagnoses   Final diagnoses:  Congestion of nasal sinus  Sinus pressure  COVID-19     Discharge Instructions      Advised patient to take medications as directed with food to completion.  Encouraged to increase daily water intake to 64 ounces per day while taking this medication.  Advised if symptoms worsen and/or unresolved please follow-up with PCP or here for further evaluation.     ED Prescriptions     Medication Sig Dispense Auth. Provider   predniSONE  (STERAPRED UNI-PAK 21 TAB) 10 MG (21) TBPK tablet Take by mouth daily. Take 6 tabs by mouth daily  for 2 days, then 5 tabs for 2 days, then 4 tabs for 2 days, then 3 tabs for 2 days, 2 tabs for 2 days, then 1 tab by mouth daily for 2 days 42 tablet Teddy Sharper, FNP      PDMP not reviewed this encounter.   Teddy Sharper, FNP 01/19/23 1016

## 2023-02-03 ENCOUNTER — Other Ambulatory Visit: Payer: Self-pay

## 2023-02-03 ENCOUNTER — Ambulatory Visit
Admission: RE | Admit: 2023-02-03 | Discharge: 2023-02-03 | Disposition: A | Discharge status: Home / Self Care | Payer: 59 | Source: Ambulatory Visit | Attending: Family Medicine | Admitting: Family Medicine

## 2023-02-03 VITALS — BP 129/81 | HR 71 | Temp 98.1°F | Resp 16

## 2023-02-03 DIAGNOSIS — R519 Headache, unspecified: Secondary | ICD-10-CM | POA: Diagnosis not present

## 2023-02-03 DIAGNOSIS — R590 Localized enlarged lymph nodes: Secondary | ICD-10-CM | POA: Diagnosis not present

## 2023-02-03 LAB — POCT RAPID STREP A (OFFICE): Rapid Strep A Screen: NEGATIVE

## 2023-02-03 NOTE — Discharge Instructions (Addendum)
If cough develops, take plain guaifenesin (1200mg  extended release tabs such as Mucinex) twice daily, with plenty of water, for cough and congestion. Get adequate rest.   For increasing sinus congestion, may use Afrin nasal spray (or generic oxymetazoline) each morning for about 5 days and then discontinue.  Also recommend using saline nasal spray several times daily and saline nasal irrigation (AYR is a common brand).  Use Flonase nasal spray each morning after using Afrin nasal spray and saline nasal irrigation.  May add Pseudoephedrine (30mg , one or two every 4 to 6 hours) for sinus congestion.  Try warm salt water gargles for sore throat.

## 2023-02-03 NOTE — ED Provider Notes (Signed)
[facility]    CSN: [id number] Arrival date & time: 02/03/23  6440      History   Chief Complaint Chief Complaint  Patient presents with   Facial Pain    HPI Micheal Brewer is a 48 y.o. male.   Patient tested positive for covid on 01/14/23, subsequently developing facial pain, aching in his upper teeth, and left ear pressure that resolved after taking a course of prednisone. Yesterday he became fatigued, developing a frontal headache, vague sore throat and neck pain.  He denies fever, congestion, and cough.  The history is provided by the patient.    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 Authorizing Provider  doxycycline (MONODOX) 50 MG capsule Take 50 mg by mouth daily. 10/01/22     lisinopril (ZESTRIL) 10 MG tablet TAKE 1 TABLET BY MOUTH EVERY DAY 10/03/22   Agapito Games, MD  pantoprazole (PROTONIX) 20 MG tablet Take 1 tablet (20 mg total) by mouth daily. Please call [phone] to schedule an office visit for more refills 12/08/22   Doree Albee, PA-C  predniSONE (STERAPRED UNI-PAK 21 TAB) 10 MG (21) TBPK tablet Take by mouth daily. Take 6 tabs by mouth daily  for 2 days, then 5 tabs for 2 days, then 4 tabs for 2 days, then 3 tabs for 2 days, 2 tabs for 2 days, then 1 tab by mouth daily for 2  days 01/19/23   Trevor Iha, FNP  vitamin B-12 (CYANOCOBALAMIN) 1000 MCG tablet Take 1 tablet (1,000 mcg total) by mouth daily. 05/10/21   Cirigliano, Verlin Dike, DO    Family History Family History  Problem Relation Age of Onset   Heart failure Mother    Hypertension Brother    Hypertension Father    Colon polyps Father    Colon cancer Paternal Uncle        great uncle    Prostate cancer Paternal Grandfather    Esophageal cancer Neg Hx    Rectal cancer Neg Hx    Stomach cancer Neg Hx     Social History Social History   Tobacco Use   Smoking status: Never   Smokeless tobacco: Never  Vaping Use   Vaping status: Never Used  Substance Use Topics   Alcohol use: Yes    Alcohol/week: 6.0 - 8.0 standard drinks of alcohol    Types: 6 - 8 Standard drinks or equivalent per week   Drug use: No     Allergies   Penicillins   Review of Systems Review of Systems + sore throat No cough No pleuritic pain No wheezing No nasal congestion No post-nasal drainage No sinus pain/pressure No itchy/red eyes No earache No hemoptysis No SOB No fever/chills No nausea No vomiting No abdominal pain No diarrhea No urinary symptoms No skin rash +  fatigue No myalgias + headache   Physical Exam Triage Vital Signs ED Triage Vitals  Encounter Vitals Group     BP 02/03/23 0837 129/81     Systolic BP Percentile --      Diastolic BP Percentile --      Pulse Rate 02/03/23 0837 71     Resp 02/03/23 0837 16     Temp 02/03/23 0837 98.1 F (36.7 C)     Temp Source 02/03/23 0837 Oral     SpO2 02/03/23 0837 99 %     Weight --      Height --      Head Circumference --      Peak Flow --      Pain Score 02/03/23 0839 4     Pain Loc --      Pain Education --      Exclude from Growth Chart --    No data found.  Updated Vital Signs BP 129/81   Pulse 71   Temp 98.1 F (36.7 C) (Oral)   Resp 16   SpO2 99%   Visual Acuity Right Eye Distance:   Left Eye Distance:   Bilateral  Distance:    Right Eye Near:   Left Eye Near:    Bilateral Near:     Physical Exam Nursing notes and Vital Signs reviewed. Appearance:  Patient appears stated age, and in no acute distress Eyes:  Pupils are equal, round, and reactive to light and accomodation.  Extraocular movement is intact.  Conjunctivae are not inflamed  Ears:  Canals normal.  Tympanic membranes normal.  Nose: Normal turbinates.  No sinus tenderness.  Pharynx: Mildly erythematous. Neck:  Supple.  Tender right tonsillar node.  Lungs:  Clear to auscultation.  Breath sounds are equal.  Moving air well. Heart:  Regular rate and rhythm without murmurs, rubs, or gallops.  Abdomen:  Nontender without masses or hepatosplenomegaly.  Bowel sounds are present.  No CVA or flank tenderness.  Extremities:  No edema.  Skin:  No rash present.   UC Treatments / Results  Labs (all labs ordered are listed, but only abnormal results are displayed) Labs Reviewed  CULTURE, GROUP A STREP [facility])  POCT RAPID STREP A (OFFICE)    EKG   Radiology No results found.  Procedures Procedures (including critical care time)  Medications Ordered in UC Medications - No data to display  Initial Impression / Assessment and Plan / UC Course  I have reviewed the triage vital signs and the nursing notes.  Pertinent labs & imaging results that were available during my care of the patient were reviewed by me and considered in my medical decision making (see chart for details).    ?early strep pharyngitis.  Throat culture pending. Treat symptomatically for now  Followup with Family Doctor if not improved in one week.   Final Clinical Impressions(s) / UC Diagnoses   Final diagnoses:  Anterior cervical adenopathy     Discharge Instructions      If cough develops, take plain guaifenesin (1200mg  extended release tabs such as Mucinex) twice daily, with plenty of water, for cough and congestion. Get adequate rest.   For increasing sinus  congestion, may use Afrin nasal spray (or generic oxymetazoline) each morning for about 5 days and then discontinue.  Also recommend using saline nasal spray several times daily and saline nasal irrigation (AYR is a common brand).  Use Flonase nasal spray each morning after using Afrin nasal spray and saline nasal irrigation.  May add Pseudoephedrine (30mg , one or two every 4 to 6 hours) for sinus congestion.  Try warm salt water gargles for sore throat.       ED Prescriptions   None       Lattie Haw, MD 02/04/23 2128

## 2023-02-03 NOTE — ED Triage Notes (Signed)
Recently had covid. Had facial pain and left ear pressure, aching in teeth as well. Was put on prednisone which helped but has since completed prednisone and facial pain is back. Pain in neck as well.

## 2023-02-06 ENCOUNTER — Telehealth: Payer: Self-pay

## 2023-02-06 LAB — CULTURE, GROUP A STREP (THRC)

## 2023-02-06 NOTE — Telephone Encounter (Signed)
Pt left msg on nurse line requesting tcx results. Callback finally made during evening and results had come back around 2p today, which patient verbalized he had seen. Encouraged to call back if any additional questions or concerns.

## 2023-02-17 ENCOUNTER — Encounter: Payer: Self-pay | Admitting: Gastroenterology

## 2023-04-05 ENCOUNTER — Other Ambulatory Visit: Payer: Self-pay | Admitting: Family Medicine

## 2023-04-05 DIAGNOSIS — I10 Essential (primary) hypertension: Secondary | ICD-10-CM

## 2023-04-17 ENCOUNTER — Ambulatory Visit: Payer: 59 | Admitting: Family Medicine

## 2023-04-17 VITALS — BP 129/71 | HR 59 | Ht 72.0 in | Wt 189.0 lb

## 2023-04-17 DIAGNOSIS — I10 Essential (primary) hypertension: Secondary | ICD-10-CM

## 2023-04-17 DIAGNOSIS — R1310 Dysphagia, unspecified: Secondary | ICD-10-CM | POA: Diagnosis not present

## 2023-04-17 NOTE — Assessment & Plan Note (Signed)
 Well controlled. Continue current regimen. Follow up in  6 months.

## 2023-04-17 NOTE — Progress Notes (Signed)
   Established Patient Office Visit  Subjective  Patient ID: Micheal Brewer, male    DOB: Jun 24, 1975  Age: 48 y.o. MRN: [id number]  Chief Complaint  Patient presents with   Hypertension    HPI  6 mo f/u HTN.  No recent chest pain or shortness of breath.  He did see dermatology back in November for several lesions on his face that he wanted evaluated they are most consistent with seborrheic keratoses.    ROS    Objective:     BP 129/71   Pulse (!) 59   Ht 6' (1.829 m)   Wt 189 lb (85.7 kg)   SpO2 100%   BMI 25.63 kg/m    Physical Exam Vitals and nursing note reviewed.  Constitutional:      Appearance: Normal appearance.  HENT:     Head: Normocephalic and atraumatic.  Eyes:     Conjunctiva/sclera: Conjunctivae normal.  Cardiovascular:     Rate and Rhythm: Normal rate and regular rhythm.  Pulmonary:     Effort: Pulmonary effort is normal.     Breath sounds: Normal breath sounds.  Skin:    General: Skin is warm and dry.  Neurological:     Mental Status: He is alert.  Psychiatric:        Mood and Affect: Mood normal.      No results found for any visits on 04/17/23.    The 10-year ASCVD risk score (Arnett DK, et al., 2019) is: 3%    Assessment & Plan:   Problem List Items Addressed This Visit       Cardiovascular and Mediastinum   Primary hypertension - Primary   Well controlled. Continue current regimen. Follow up in  6 months.       Relevant Orders   Basic Metabolic Panel (BMET)     Digestive   Dysphagia   In the last few months he has noticed a couple of episodes of difficulty feeling like things were getting stuck in the esophagus he does have his follow-up appointment with Dr. Barron Alvine tomorrow.  Does take pantoprazole daily.       Return in about 6 months (around 10/17/2023) for Wellness Exam, Hypertension.    Nani Gasser, MD

## 2023-04-17 NOTE — Assessment & Plan Note (Signed)
 In the last few months he has noticed a couple of episodes of difficulty feeling like things were getting stuck in the esophagus he does have his follow-up appointment with Dr. Barron Alvine tomorrow.  Does take pantoprazole daily.

## 2023-04-18 ENCOUNTER — Ambulatory Visit: Payer: 59 | Admitting: Gastroenterology

## 2023-04-18 ENCOUNTER — Encounter: Payer: Self-pay | Admitting: Family Medicine

## 2023-04-18 LAB — BASIC METABOLIC PANEL WITH GFR
BUN/Creatinine Ratio: 14 (ref 9–20)
BUN: 14 mg/dL (ref 6–24)
CO2: 24 mmol/L (ref 20–29)
Calcium: 9.9 mg/dL (ref 8.7–10.2)
Chloride: 103 mmol/L (ref 96–106)
Creatinine, Ser: 1.02 mg/dL (ref 0.76–1.27)
Glucose: 92 mg/dL (ref 70–99)
Potassium: 4.4 mmol/L (ref 3.5–5.2)
Sodium: 139 mmol/L (ref 134–144)
eGFR: 91 mL/min/{1.73_m2} (ref >59)

## 2023-04-18 NOTE — Progress Notes (Signed)
 Your lab work is within acceptable range and there are no concerning findings.   ?

## 2023-05-19 ENCOUNTER — Ambulatory Visit: Admitting: Physician Assistant

## 2023-05-19 ENCOUNTER — Encounter: Payer: Self-pay | Admitting: Physician Assistant

## 2023-05-19 VITALS — BP 122/80 | HR 76 | Ht 73.0 in | Wt 188.0 lb

## 2023-05-19 DIAGNOSIS — K200 Eosinophilic esophagitis: Secondary | ICD-10-CM | POA: Diagnosis not present

## 2023-05-19 DIAGNOSIS — Z860100 Personal history of colon polyps, unspecified: Secondary | ICD-10-CM

## 2023-05-19 DIAGNOSIS — E538 Deficiency of other specified B group vitamins: Secondary | ICD-10-CM | POA: Diagnosis not present

## 2023-05-19 DIAGNOSIS — K222 Esophageal obstruction: Secondary | ICD-10-CM

## 2023-05-19 MED ORDER — PANTOPRAZOLE SODIUM 20 MG PO TBEC: 20.0000 mg | DELAYED_RELEASE_TABLET | Freq: Every day | ORAL | 3 refills | Status: DC

## 2023-05-19 NOTE — Progress Notes (Signed)
 05/19/2023 Micheal Brewer [id number] 05-11-1975  Referring provider: Cydney Brewer, * Primary GI doctor: Dr. Karene Brewer  ASSESSMENT AND PLAN:  Eosinophilic esophagitis  with history of esophageal stricture 2021 08/2022 increased Protonix  to 20 mg BID due to intermittent dysphagia, did for 8 weeks and is back down to once a day Since then he has had 2 episodes of dysphagia with chicken and rice, and eating too quickly 4 mixed drinks Friday and Saturday night No weight loss, no melena - discussed cutting back on ETOH - continue protonix  20 mg once a day -Eat slowly, chew food well before swallowing.  -Drink liquids in between each bite to avoid food impaction. - discussed EGD or not, states it is very intermittent and just chicken and rice, declines EGD at this time, if any worsening symptoms he will call our office and get EGD or go to ER if food impaction  History of colon polyps Colonoscopy 02/03/2021 2 subcentimeter polyps SSP x 2, internal hemorrhoids normal TI recall 01/2026  B12 deficiency On 1000 mcg daily Still on it  Patient Care Team: Micheal Draft, MD as PCP - General  HISTORY OF PRESENT ILLNESS: 48 y.o. male with a past medical history listed below presents for evaluation of EOE.  Patient last seen 09/08/2022 by myself for intermittent dysphagia, discussed possible endoscopic evaluation at that time but patient declined Protonix  increased to 20 mg twice daily  Discussed the use of AI scribe software for clinical note transcription with the patient, who gave verbal consent to proceed.  History of Present Illness   Micheal Brewer is a 48 year old male with eosinophilic esophagitis who presents for a follow-up regarding swallowing difficulties.  He is experiencing mild swallowing difficulties, which are infrequent and typically occur when eating chicken and rice too quickly. He has had about two recent episodes, which he describes as  'self-inflicted'.  He is currently taking Protonix  (pantoprazole ) once daily, having previously increased the dose to twice daily for six weeks. No heartburn, abdominal pain, or significant changes in bowel habits, including no blood in the stool, diarrhea, or constipation. No weight loss, fever, chills, or shortness of breath.  He consumes approximately eight alcoholic drinks per week, typically four drinks on Friday and Saturday nights, consisting of mixed drinks. He does not smoke and occasionally uses ibuprofen. He continues to take B12 supplements as part of his medication regimen.     He  reports that he has never smoked. He has never used smokeless tobacco. He reports current alcohol use of about 6.0 - 8.0 standard drinks of alcohol per week. He reports that he does not use drugs.  RELEVANT GI HISTORY, IMAGING AND LABS: Results   DIAGNOSTIC Endoscopy: Eosinophilic esophagitis with strictures and food impactions     Endoscopic History: -EGD (2/ 12/2019, Dr. Karene Brewer): Feline appearance and longitudinal furrows (biopsies with elevated eosinophils: 30 distal/40 proximal per hpf).  54 French Maloney dilator with mucosal rent.  Mild non-H. pylori gastritis, benign gastric polyps.  Started on high-dose PPI -EGD (05/06/2019, Dr. Karene Brewer): Felinization and longitudinal furrows (biopsies: <10 eos distal, 17 eos proximal esophagus), Mild lower esophageal stenosis dilated with 20 mm TTS balloon with mucosal rent, then fractured with cold forceps.  1 cm sliding HH, 6 mm antral hyperplastic polyp, normal duodenum with biopsies negative for eosinophilic enteritis - Colonoscopy (02/03/2021, Dr. Karene Brewer): 2 subcentimeter polyps (path: SSP x2), Internal hemorrhoids.  Normal TI.  Repeat in 5 years CBC  Component Value Date/Time   WBC 4.3 10/10/2022 0818   WBC 4.5 05/06/2021 1018   RBC 4.42 10/10/2022 0818   RBC 4.25 05/06/2021 1018   HGB 14.7 10/10/2022 0818   HCT 43.4 10/10/2022 0818   PLT  225 10/10/2022 0818   MCV 98 (H) 10/10/2022 0818   MCH 33.3 (H) 10/10/2022 0818   MCH 34.2 (H) 01/28/2019 0822   MCHC 33.9 10/10/2022 0818   MCHC 34.5 05/06/2021 1018   RDW 12.0 10/10/2022 0818   Recent Labs    10/10/22 0818  HGB 14.7    CMP     Component Value Date/Time   NA 139 04/17/2023 0758   K 4.4 04/17/2023 0758   CL 103 04/17/2023 0758   CO2 24 04/17/2023 0758   GLUCOSE 92 04/17/2023 0758   GLUCOSE 72 03/08/2022 0924   BUN 14 04/17/2023 0758   CREATININE 1.02 04/17/2023 0758   CREATININE 1.02 03/08/2022 0924   CALCIUM 9.9 04/17/2023 0758   PROT 6.8 10/10/2022 0818   ALBUMIN 4.5 10/10/2022 0818   AST 22 10/10/2022 0818   ALT 13 10/10/2022 0818   ALKPHOS 70 10/10/2022 0818   BILITOT 0.4 10/10/2022 0818   GFRNONAA 77 07/23/2020 0000   GFRAA 89 07/23/2020 0000      Latest Ref Rng & Units 10/10/2022    8:18 AM 03/08/2022    9:24 AM 07/23/2020   12:00 AM  Hepatic Function  Total Protein 6.0 - 8.5 g/dL 6.8  6.8  6.8   Albumin 4.1 - 5.1 g/dL 4.5     AST 0 - 40 IU/L 22  24  21    ALT 0 - 44 IU/L 13  13  15    Alk Phosphatase 44 - 121 IU/L 70     Total Bilirubin 0.0 - 1.2 mg/dL 0.4  0.5  1.0       Current Medications:    Current Outpatient Medications (Cardiovascular):    lisinopril  (ZESTRIL ) 10 MG tablet, TAKE 1 TABLET BY MOUTH EVERY DAY    Current Outpatient Medications (Hematological):    vitamin B-12 (CYANOCOBALAMIN) 1000 MCG tablet, Take 1 tablet (1,000 mcg total) by mouth daily.  Current Outpatient Medications (Other):    doxycycline  (MONODOX ) 50 MG capsule, Take 50 mg by mouth daily.   pantoprazole  (PROTONIX ) 20 MG tablet, Take 1 tablet (20 mg total) by mouth daily. Please call [phone] to schedule an office visit for more refills  Medical History:  Past Medical History:  Diagnosis Date   Hyperlipidemia 03/08/2022   Primary hypertension 12/19/2019   Trigeminal neuralgia pain 07/30/2019   Allergies:  Allergies  Allergen Reactions    Penicillins Rash    REACTION: rash     Surgical History:  He  has a past surgical history that includes Upper gastrointestinal endoscopy. Family History:  His family history includes Colon cancer in his paternal uncle; Colon polyps in his father; Heart failure in his mother; Hypertension in his brother and father; Prostate cancer in his paternal grandfather.  REVIEW OF SYSTEMS  : All other systems reviewed and negative except where noted in the History of Present Illness.  PHYSICAL EXAM: BP 122/80   Pulse 76   Ht 6\' 1"  (1.854 m)   Wt 188 lb (85.3 kg)   BMI 24.80 kg/m  Physical Exam   GENERAL APPEARANCE: Well nourished, in no apparent distress. HEENT: No cervical lymphadenopathy, unremarkable thyroid, sclerae anicteric, conjunctiva pink. RESPIRATORY: Respiratory effort normal, breath sounds clear to auscultation bilaterally without rales, rhonchi, or  wheezing. CARDIO: Regular rate and rhythm with no murmurs, rubs, or gallops, peripheral pulses intact. ABDOMEN: Abdomen soft, non-tender, non-distended, active bowel sounds in all four quadrants, no rebound tenderness, no masses appreciated. RECTAL: Declines. MUSCULOSKELETAL: Full range of motion, normal gait, without edema. SKIN: Dry, intact without rashes or lesions. No jaundice. NEURO: Alert, oriented, no focal deficits. PSYCH: Cooperative, normal mood and affect.      Edmonia Gottron, PA-C 9:31 AM

## 2023-05-19 NOTE — Patient Instructions (Addendum)
 Dysphagia precautions:  1. Take reflux medications 30+ minutes before food in the morning 2. Begin meals with warm beverage 3. Eat smaller more frequent meals 4. Eat slowly, taking small bites and sips 5. Alternate solids and liquids 6. Avoid foods/liquids that increase acid production 7. Sit upright during and for 30+ minutes after meals to facilitate esophageal clearing 8. All meats should be chopped finely.   If something gets hung in your esophagus and will not come up or go down, proceed to the emergency room.    Please take your proton pump inhibitor medication, pantoprazole   Please take this medication 30 minutes to 1 hour before meals- this makes it more effective.  Avoid spicy and acidic foods Avoid fatty foods Limit your intake of coffee, tea, alcohol, and carbonated drinks Work to maintain a healthy weight Keep the head of the bed elevated at least 3 inches with blocks or a wedge pillow if you are having any nighttime symptoms Stay upright for 2 hours after eating Avoid meals and snacks three to four hours before bedtime   Thank you for trusting me with your gastrointestinal care!   Santina Cull, PA-C  _______________________________________________________  If your blood pressure at your visit was 140/90 or greater, please contact your primary care physician to follow up on this.  _______________________________________________________  If you are age 89 or older, your body mass index should be between 23-30. Your Body mass index is 24.8 kg/m. If this is out of the aforementioned range listed, please consider follow up with your Primary Care Provider.  If you are age 79 or younger, your body mass index should be between 19-25. Your Body mass index is 24.8 kg/m. If this is out of the aformentioned range listed, please consider follow up with your Primary Care Provider.   ________________________________________________________  The  GI providers would like  to encourage you to use MYCHART to communicate with providers for non-urgent requests or questions.  Due to long hold times on the telephone, sending your provider a message by [facility] may be a faster and more efficient way to get a response.  Please allow 48 business hours for a response.  Please remember that this is for non-urgent requests.  _______________________________________________________

## 2023-05-30 NOTE — Progress Notes (Signed)
 Agree with the assessment and plan as outlined by Quentin Mulling, PA-C. ? ?Keron Neenan, DO, FACG ? ?

## 2023-06-14 ENCOUNTER — Ambulatory Visit: Admitting: Family Medicine

## 2023-06-14 ENCOUNTER — Encounter: Payer: Self-pay | Admitting: Family Medicine

## 2023-06-14 VITALS — BP 135/85 | HR 60 | Ht 73.0 in | Wt 187.0 lb

## 2023-06-14 DIAGNOSIS — R1310 Dysphagia, unspecified: Secondary | ICD-10-CM | POA: Diagnosis not present

## 2023-06-14 DIAGNOSIS — J392 Other diseases of pharynx: Secondary | ICD-10-CM | POA: Insufficient documentation

## 2023-06-14 MED ORDER — PREDNISONE 20 MG PO TABS: 20.0000 mg | ORAL_TABLET | Freq: Two times a day (BID) | ORAL | 0 refills | Status: AC

## 2023-06-14 NOTE — Assessment & Plan Note (Signed)
 I think this is related to a combination of post nasal drainage and his EOE.  Adding course of prednisone  20mg  BID x5 days and increase pantoprazole  to BID x2 weeks.  May need GI follow up if not improving.

## 2023-06-14 NOTE — Progress Notes (Signed)
 Sx started 06/03/23. One of his children was sick and he thought his sx could be attributed to that illness. Feels like something is stuck in his throat with some tightness and dry cough. Pt has Hx of Eosinophilic esophagitis.

## 2023-06-14 NOTE — Patient Instructions (Addendum)
 Increase pantoprazole  20mg  TWICE per day.  Take prednisone  20mg  twice daily for 5 days.  Let us  know if symptoms are not improving.

## 2023-06-14 NOTE — Progress Notes (Signed)
 ISSAK Brewer - 48 y.o. male MRN [id number]  Date of birth: 1975/02/15  Subjective Chief Complaint  Patient presents with   Sore Throat    HPI Micheal Brewer is a 48 y.o. male here today with complaint of feeling of tightness in his throat.  His son had a cold a couple of weeks ago which he thinks he caught.  He has had some post nasal drainage.  He has had mild cough with clear phlegm.  He does also have history of EOE with more reflux symptoms recently.  He is taking protonix  20mg  daily.    ROS:  A comprehensive ROS was completed and negative except as noted per HPI  Allergies  Allergen Reactions   Penicillins Rash    REACTION: rash    Past Medical History:  Diagnosis Date   Hyperlipidemia 03/08/2022   Primary hypertension 12/19/2019   Trigeminal neuralgia pain 07/30/2019    Past Surgical History:  Procedure Laterality Date   UPPER GASTROINTESTINAL ENDOSCOPY     02-2019, 04-2019    Social History   Socioeconomic History   Marital status: Married    Spouse name: Crystal   Number of children: 2   Years of education: Not on file   Highest education level: Bachelor's degree (e.g., BA, AB, BS)  Occupational History   Occupation: Civil engineer, contracting: UNEMPLOYED  Tobacco Use   Smoking status: Never   Smokeless tobacco: Never  Vaping Use   Vaping status: Never Used  Substance and Sexual Activity   Alcohol use: Yes    Alcohol/week: 6.0 - 8.0 standard drinks of alcohol    Types: 6 - 8 Standard drinks or equivalent per week   Drug use: No   Sexual activity: Not on file  Other Topics Concern   Not on file  Social History Narrative   Works out regularly.    Social Drivers of Corporate investment banker Strain: Low Risk  (04/17/2023)   Overall Financial Resource Strain (CARDIA)    Difficulty of Paying Living Expenses: Not hard at all  Food Insecurity: No Food Insecurity (04/17/2023)   Hunger Vital Sign    Worried About Running Out of Food in the Last Year:  Never true    Ran Out of Food in the Last Year: Never true  Transportation Needs: No Transportation Needs (04/17/2023)   PRAPARE - Administrator, Civil Service (Medical): No    Lack of Transportation (Non-Medical): No  Physical Activity: Sufficiently Active (04/17/2023)   Exercise Vital Sign    Days of Exercise per Week: 5 days    Minutes of Exercise per Session: 30 min  Stress: No Stress Concern Present (04/17/2023)   [employer] of Occupational Health - Occupational Stress Questionnaire    Feeling of Stress : Only a little  Social Connections: Socially Integrated (04/17/2023)   Social Connection and Isolation Panel [NHANES]    Frequency of Communication with Friends and Family: More than three times a week    Frequency of Social Gatherings with Friends and Family: Once a week    Attends Religious Services: 1 to 4 times per year    Active Member of [employer] or Organizations: Yes    Attends Engineer, structural: More than 4 times per year    Marital Status: Married    Family History  Problem Relation Age of Onset   Heart failure Mother    Hypertension Brother    Hypertension Father  Colon polyps Father    Colon cancer Paternal Uncle        great uncle    Prostate cancer Paternal Grandfather    Esophageal cancer Neg Hx    Rectal cancer Neg Hx    Stomach cancer Neg Hx     Health Maintenance  Topic Date Due   HIV Screening  Never done   COVID-19 Vaccine (3 - 2024-25 season) 10/26/2023 (Originally 09/18/2022)   INFLUENZA VACCINE  08/18/2023   Colonoscopy  02/03/2026   DTaP/Tdap/Td (2 - Td or Tdap) 11/28/2027   Hepatitis C Screening  Completed   HPV VACCINES  Aged Out   Meningococcal B Vaccine  Aged Out     ----------------------------------------------------------------------------------------------------------------------------------------------------------------------------------------------------------------- Physical Exam BP 135/85 (BP Location:  Left Arm, Patient Position: Sitting, Cuff Size: Normal)   Pulse 60   Ht 6\' 1"  (1.854 m)   Wt 187 lb (84.8 kg)   SpO2 98%   BMI 24.67 kg/m   Physical Exam Constitutional:      Appearance: He is well-developed.  HENT:     Head: Normocephalic and atraumatic.  Eyes:     General: No scleral icterus. Cardiovascular:     Rate and Rhythm: Normal rate and regular rhythm.  Pulmonary:     Effort: Pulmonary effort is normal.     Breath sounds: Normal breath sounds.  Neurological:     Mental Status: He is alert.  Psychiatric:        Mood and Affect: Mood normal.        Behavior: Behavior normal.     ------------------------------------------------------------------------------------------------------------------------------------------------------------------------------------------------------------------- Assessment and Plan  Throat irritation I think this is related to a combination of post nasal drainage and his EOE.  Adding course of prednisone  20mg  BID x5 days and increase pantoprazole  to BID x2 weeks.  May need GI follow up if not improving.    Meds ordered this encounter  Medications   predniSONE  (DELTASONE ) 20 MG tablet    Sig: Take 1 tablet (20 mg total) by mouth 2 (two) times daily with a meal for 5 days.    Dispense:  10 tablet    Refill:  0    No follow-ups on file.

## 2023-06-26 MED ORDER — PANTOPRAZOLE SODIUM 20 MG PO TBEC
20.0000 mg | DELAYED_RELEASE_TABLET | Freq: Two times a day (BID) | ORAL | 0 refills | Status: DC
Start: 2023-06-26 — End: 2023-08-04

## 2023-07-11 NOTE — Progress Notes (Unsigned)
 07/12/2023 Micheal Brewer [id number] 07-25-1975  Referring provider: [name] Dorothyann BIRCH, * Primary GI doctor: Dr. San  ASSESSMENT AND PLAN:  Eosinophilic esophagitis  with history of esophageal stricture 2021 Since child was sick has had hoarseness, with upper pharyngeal right discomfort, increased protonix  20 mg Bid and prednisone  x 1 week from PCP This improve pharyngeal symptoms but now has mid esophageal globulus sensation and dysphagia with granola bars, has hoarseness No weight loss, no melena - continue protonix  20 mg once a day for now, pending EGD may have to increase PPI, consider steroid slurry -Eat slowly, chew food well before swallowing.  -Drink liquids in between each bite to avoid food impaction. - will plan on EGD at [facility] to evaluate for stricture, possible dilation, patient would prefer to do this as soon as possible, for his schedule Dr. Suzann July 1st at 830 works, will discuss with providers but schedule. I discussed risks of EGD with patient today, including risk of sedation, bleeding or perforation.  Patient provides understanding and gave verbal consent to proceed. - if unremarkable, consider referral to ENT  History of colon polyps Colonoscopy 02/03/2021 2 subcentimeter polyps SSP x 2, internal hemorrhoids normal TI recall 01/2026  B12 deficiency On 1000 mcg daily  Patient Care Team: [name] Dorothyann BIRCH, MD as PCP - General  HISTORY OF PRESENT ILLNESS: 48 y.o. male with a past medical history listed below presents for evaluation of EOE.   Patient last seen in the office 05/19/2023 by myself for eosinophilic esophagitis with intermittent dysphagia.  Increase pantoprazole  20 mg twice a daily 08/2022 and since that time back to once daily as well he had 2 episodes of dysphagia with eating quickly.  No weight loss melena.   Discussed cutting back on alcohol patient declined EGD.  Discussed the use of AI scribe software for clinical note  transcription with the patient, who gave verbal consent to proceed.  History of Present Illness   Micheal Brewer is a 48 year old male with eosinophilic esophagitis who presents with persistent throat discomfort and swallowing difficulties. He was referred by Dr. Alvia for evaluation of persistent esophageal symptoms.  Since May 17, he has experienced a sensation of something being stuck in the back of his throat, initially thought to be related to a cold caught from his children. Despite initial expectations that it would resolve, the sensation persisted, prompting a visit to his general practitioner.  His Protonix  (pantoprazole ) dosage was adjusted to twice daily for two weeks, followed by a reduction to once daily. Additionally, a one-week course of prednisone  was prescribed. This treatment alleviated the sensation in the upper throat, but the discomfort seemed to move lower, to the mid-esophagus.  Currently, he has a persistent feeling of something in the mid-esophagus, particularly when swallowing dry foods like granola bars, which feel as though they do not fully go down. No painful swallowing, regurgitation, weight loss, or black stools. Occasional hoarseness and a dry cough are present, but no shortness of breath, chest pain, or abdominal pain.  He is currently taking Protonix  once daily. He has not been on any antibiotics, Aleve, or ibuprofen, and reports no fevers, chills, or significant sinus drainage. He has reduced his alcohol consumption recently.     He  reports that he has never smoked. He has never used smokeless tobacco. He reports current alcohol use of about 6.0 - 8.0 standard drinks of alcohol per week. He reports that he does not use drugs.  RELEVANT GI HISTORY, IMAGING AND LABS: Results         Endoscopic History: -EGD (2/ 12/2019, Dr. San): Feline appearance and longitudinal furrows (biopsies with elevated eosinophils: 30 distal/40 proximal per hpf).  54  French Maloney dilator with mucosal rent.  Mild non-H. pylori gastritis, benign gastric polyps.  Started on high-dose PPI -EGD (05/06/2019, Dr. San): Felinization and longitudinal furrows (biopsies: <10 eos distal, 17 eos proximal esophagus), Mild lower esophageal stenosis dilated with 20 mm TTS balloon with mucosal rent, then fractured with cold forceps.  1 cm sliding HH, 6 mm antral hyperplastic polyp, normal duodenum with biopsies negative for eosinophilic enteritis - Colonoscopy (02/03/2021, Dr. San): 2 subcentimeter polyps (path: SSP x2), Internal hemorrhoids.  Normal TI.  Repeat in 5 years CBC    Component Value Date/Time   WBC 4.3 10/10/2022 0818   WBC 4.5 05/06/2021 1018   RBC 4.42 10/10/2022 0818   RBC 4.25 05/06/2021 1018   HGB 14.7 10/10/2022 0818   HCT 43.4 10/10/2022 0818   PLT 225 10/10/2022 0818   MCV 98 (H) 10/10/2022 0818   MCH 33.3 (H) 10/10/2022 0818   MCH 34.2 (H) 01/28/2019 0822   MCHC 33.9 10/10/2022 0818   MCHC 34.5 05/06/2021 1018   RDW 12.0 10/10/2022 0818   Recent Labs    10/10/22 0818  HGB 14.7    CMP     Component Value Date/Time   NA 139 04/17/2023 0758   K 4.4 04/17/2023 0758   CL 103 04/17/2023 0758   CO2 24 04/17/2023 0758   GLUCOSE 92 04/17/2023 0758   GLUCOSE 72 03/08/2022 0924   BUN 14 04/17/2023 0758   CREATININE 1.02 04/17/2023 0758   CREATININE 1.02 03/08/2022 0924   CALCIUM 9.9 04/17/2023 0758   PROT 6.8 10/10/2022 0818   ALBUMIN 4.5 10/10/2022 0818   AST 22 10/10/2022 0818   ALT 13 10/10/2022 0818   ALKPHOS 70 10/10/2022 0818   BILITOT 0.4 10/10/2022 0818   GFRNONAA 77 07/23/2020 0000   GFRAA 89 07/23/2020 0000      Latest Ref Rng & Units 10/10/2022    8:18 AM 03/08/2022    9:24 AM 07/23/2020   12:00 AM  Hepatic Function  Total Protein 6.0 - 8.5 g/dL 6.8  6.8  6.8   Albumin 4.1 - 5.1 g/dL 4.5     AST 0 - 40 IU/L 22  24  21    ALT 0 - 44 IU/L 13  13  15    Alk Phosphatase 44 - 121 IU/L 70     Total Bilirubin 0.0 -  1.2 mg/dL 0.4  0.5  1.0       Current Medications:    Current Outpatient Medications (Cardiovascular):    lisinopril  (ZESTRIL ) 10 MG tablet, TAKE 1 TABLET BY MOUTH EVERY DAY    Current Outpatient Medications (Hematological):    vitamin B-12 (CYANOCOBALAMIN) 1000 MCG tablet, Take 1 tablet (1,000 mcg total) by mouth daily.  Current Outpatient Medications (Other):    doxycycline  (MONODOX ) 50 MG capsule, Take 50 mg by mouth daily.   pantoprazole  (PROTONIX ) 20 MG tablet, Take 1 tablet (20 mg total) by mouth daily. Please call [phone] to schedule an office visit for more refills   pantoprazole  (PROTONIX ) 20 MG tablet, Take 1 tablet (20 mg total) by mouth 2 (two) times daily for 7 days.  Medical History:  Past Medical History:  Diagnosis Date   Hyperlipidemia 03/08/2022   Primary hypertension 12/19/2019   Trigeminal neuralgia pain 07/30/2019  Allergies:  Allergies  Allergen Reactions   Penicillins Rash    REACTION: rash     Surgical History:  He  has a past surgical history that includes Upper gastrointestinal endoscopy. Family History:  His family history includes Colon cancer in his paternal uncle; Colon polyps in his father; Heart failure in his mother; Hypertension in his brother and father; Prostate cancer in his paternal grandfather.  REVIEW OF SYSTEMS  : All other systems reviewed and negative except where noted in the History of Present Illness.  PHYSICAL EXAM: BP 110/64   Pulse 75   Ht 6' (1.829 m)   Wt 190 lb 3.2 oz (86.3 kg)   BMI 25.80 kg/m  Physical Exam   GENERAL APPEARANCE: Well nourished, in no apparent distress. HEENT: Erythema on posterior pharynx, no plaques or yeast. No cervical lymphadenopathy, unremarkable thyroid, sclerae anicteric, conjunctiva pink. RESPIRATORY: Respiratory effort normal, lungs clear to auscultation, no shortness of breath, breath sounds equal bilaterally without rales, rhonchi, or wheezing. CARDIO: RRR with no MRGs, peripheral  pulses intact. ABDOMEN: Soft, non-distended, active bowel sounds in all 4 quadrants, non-tender, no rebound, no mass appreciated. RECTAL: Declines. MUSCULOSKELETAL: Full ROM, normal gait, without edema. SKIN: Dry, intact without rashes or lesions. No jaundice. NEURO: Alert, oriented, no focal deficits. PSYCH: Cooperative, normal mood and affect.      Alan JONELLE Coombs, PA-C 9:26 AM

## 2023-07-12 ENCOUNTER — Encounter: Payer: Self-pay | Admitting: Pediatrics

## 2023-07-12 ENCOUNTER — Ambulatory Visit: Admitting: Physician Assistant

## 2023-07-12 ENCOUNTER — Encounter: Payer: Self-pay | Admitting: Physician Assistant

## 2023-07-12 VITALS — BP 110/64 | HR 75 | Ht 72.0 in | Wt 190.2 lb

## 2023-07-12 DIAGNOSIS — K222 Esophageal obstruction: Secondary | ICD-10-CM

## 2023-07-12 DIAGNOSIS — K200 Eosinophilic esophagitis: Secondary | ICD-10-CM | POA: Diagnosis not present

## 2023-07-12 DIAGNOSIS — R1310 Dysphagia, unspecified: Secondary | ICD-10-CM

## 2023-07-12 DIAGNOSIS — Z860101 Personal history of adenomatous and serrated colon polyps: Secondary | ICD-10-CM | POA: Diagnosis not present

## 2023-07-12 DIAGNOSIS — E538 Deficiency of other specified B group vitamins: Secondary | ICD-10-CM

## 2023-07-12 NOTE — Patient Instructions (Signed)
 You have been scheduled for an endoscopy. Please follow written instructions given to you at your visit today.  If you use inhalers (even only as needed), please bring them with you on the day of your procedure.  If you take any of the following medications, they will need to be adjusted prior to your procedure:   DO NOT TAKE 7 DAYS PRIOR TO TEST- Trulicity (dulaglutide) Ozempic, Wegovy (semaglutide) Mounjaro (tirzepatide) Bydureon Bcise (exanatide extended release)  DO NOT TAKE 1 DAY PRIOR TO YOUR TEST Rybelsus (semaglutide) Adlyxin (lixisenatide) Victoza (liraglutide) Byetta (exanatide) ___________________________________________________________________________    Due to recent changes in healthcare laws, you may see the results of your imaging and laboratory studies on MyChart before your provider has had a chance to review them.  We understand that in some cases there may be results that are confusing or concerning to you. Not all laboratory results come back in the same time frame and the provider may be waiting for multiple results in order to interpret others.  Please give us  48 hours in order for your provider to thoroughly review all the results before contacting the office for clarification of your results.    I appreciate the  opportunity to care for you  Thank You   [facility]

## 2023-07-17 NOTE — Progress Notes (Unsigned)
 [facility] History and Physical   Primary Care Physician:  [name] Dorothyann BIRCH, MD   Reason for Procedure:  Dysphagia, history of eosinophilic esophagitis and esophageal stricture  Plan:    Upper endoscopy     HPI: Micheal Brewer is a 48 y.o. male undergoing upper endoscopy for evaluation of dysphagia, history of EOE and esophageal stricture.  Patient was diagnosed with the EOE 02/2019 when EGD showed linear furling and feline appearance of the esophagus.  Biopsies with greater than 30-40 eosinophils per high-power field.  Maloney dilation with a 43 French dilator was performed.  Follow-up EGD 2 months later disclosed endoscopic changes of EOE but significant rejection and eosinophils on biopsy.  A stenosis at the GE junction was balloon dilated to 20 mm in the balloon dragged through the proximal esophagus.   Recently seen in clinic regarding symptoms of dysphagia.  Currently on Protonix  20 mg orally daily.  Noted improvement in symptoms when he had a higher dose of PPI and was on prednisone .   Past Medical History:  Diagnosis Date   Hyperlipidemia 03/08/2022   Primary hypertension 12/19/2019   Trigeminal neuralgia pain 07/30/2019    Past Surgical History:  Procedure Laterality Date   UPPER GASTROINTESTINAL ENDOSCOPY     02-2019, 04-2019    Prior to Admission medications   Medication Sig Start Date End Date Taking? Authorizing Provider  doxycycline  (MONODOX ) 50 MG capsule Take 50 mg by mouth daily. 10/01/22     lisinopril  (ZESTRIL ) 10 MG tablet TAKE 1 TABLET BY MOUTH EVERY DAY 04/05/23   [name] Dorothyann BIRCH, MD  pantoprazole  (PROTONIX ) 20 MG tablet Take 1 tablet (20 mg total) by mouth daily. Please call [phone] to schedule an office visit for more refills 05/19/23   Craig Alan SAUNDERS, PA-C  pantoprazole  (PROTONIX ) 20 MG tablet Take 1 tablet (20 mg total) by mouth 2 (two) times daily for 7 days. 06/26/23 07/12/23  Craig Alan SAUNDERS, PA-C  vitamin B-12 (CYANOCOBALAMIN)  1000 MCG tablet Take 1 tablet (1,000 mcg total) by mouth daily. 05/10/21   Cirigliano, Vito V, DO    Current Outpatient Medications  Medication Sig Dispense Refill   doxycycline  (MONODOX ) 50 MG capsule Take 50 mg by mouth daily.     lisinopril  (ZESTRIL ) 10 MG tablet TAKE 1 TABLET BY MOUTH EVERY DAY 30 tablet 5   pantoprazole  (PROTONIX ) 20 MG tablet Take 1 tablet (20 mg total) by mouth daily. Please call [phone] to schedule an office visit for more refills 90 tablet 3   vitamin B-12 (CYANOCOBALAMIN) 1000 MCG tablet Take 1 tablet (1,000 mcg total) by mouth daily. 30 tablet 2   pantoprazole  (PROTONIX ) 20 MG tablet Take 1 tablet (20 mg total) by mouth 2 (two) times daily for 7 days. 14 tablet 0   Current Facility-Administered Medications  Medication Dose Route Frequency Provider Last Rate Last Admin   0.9 %  sodium chloride  infusion  500 mL Intravenous Once Payslee Bateson M, MD        Allergies as of 07/18/2023 - Review Complete 07/18/2023  Allergen Reaction Noted   Penicillins Rash 12/09/2019    Family History  Problem Relation Age of Onset   Heart failure Mother    Hypertension Brother    Hypertension Father    Colon polyps Father    Colon cancer Paternal Uncle        great uncle    Prostate cancer Paternal Grandfather    Esophageal cancer Neg Hx    Rectal cancer Neg  Hx    Stomach cancer Neg Hx     Social History   Socioeconomic History   Marital status: Married    Spouse name: Crystal   Number of children: 2   Years of education: Not on file   Highest education level: Bachelor's degree (e.g., BA, AB, BS)  Occupational History   Occupation: Civil engineer, contracting: UNEMPLOYED  Tobacco Use   Smoking status: Never   Smokeless tobacco: Never  Vaping Use   Vaping status: Never Used  Substance and Sexual Activity   Alcohol use: Yes    Alcohol/week: 6.0 - 8.0 standard drinks of alcohol    Types: 6 - 8 Standard drinks or equivalent per week   Drug use: No    Sexual activity: Yes  Other Topics Concern   Not on file  Social History Narrative   Works out regularly.    Social Drivers of Corporate investment banker Strain: Low Risk  (04/17/2023)   Overall Financial Resource Strain (CARDIA)    Difficulty of Paying Living Expenses: Not hard at all  Food Insecurity: No Food Insecurity (04/17/2023)   Hunger Vital Sign    Worried About Running Out of Food in the Last Year: Never true    Ran Out of Food in the Last Year: Never true  Transportation Needs: No Transportation Needs (04/17/2023)   PRAPARE - Administrator, Civil Service (Medical): No    Lack of Transportation (Non-Medical): No  Physical Activity: Sufficiently Active (04/17/2023)   Exercise Vital Sign    Days of Exercise per Week: 5 days    Minutes of Exercise per Session: 30 min  Stress: No Stress Concern Present (04/17/2023)   [employer] of Occupational Health - Occupational Stress Questionnaire    Feeling of Stress : Only a little  Social Connections: Socially Integrated (04/17/2023)   Social Connection and Isolation Panel    Frequency of Communication with Friends and Family: More than three times a week    Frequency of Social Gatherings with Friends and Family: Once a week    Attends Religious Services: 1 to 4 times per year    Active Member of [employer] or Organizations: Yes    Attends Banker Meetings: More than 4 times per year    Marital Status: Married  Catering manager Violence: Unknown (04/19/2021)   Received from [facility]   HITS    Physically Hurt: Not on file    Insult or Talk Down To: Not on file    Threaten Physical Harm: Not on file    Scream or Curse: Not on file    Review of Systems:  All other review of systems negative except as mentioned in the HPI.  Physical Exam: Vital signs BP (!) 110/59   Pulse 60   Temp 98 F (36.7 C)   Ht 6' (1.829 m)   Wt 190 lb (86.2 kg)   BMI 25.77 kg/m   General:   Alert,  Well-developed,  well-nourished, pleasant and cooperative in NAD Airway:  Mallampati 2 Lungs:  Clear throughout to auscultation.   Heart:  Regular rate and rhythm; no murmurs, clicks, rubs,  or gallops. Abdomen:  Soft, nontender and nondistended. Normal bowel sounds.   Neuro/Psych:  Normal mood and affect. A and O x 3  Inocente Hausen, MD [facility]

## 2023-07-18 ENCOUNTER — Encounter: Payer: Self-pay | Admitting: Pediatrics

## 2023-07-18 ENCOUNTER — Ambulatory Visit: Admitting: Pediatrics

## 2023-07-18 VITALS — BP 103/66 | HR 62 | Temp 98.0°F | Resp 11 | Ht 72.0 in | Wt 190.0 lb

## 2023-07-18 DIAGNOSIS — K200 Eosinophilic esophagitis: Secondary | ICD-10-CM

## 2023-07-18 DIAGNOSIS — K317 Polyp of stomach and duodenum: Secondary | ICD-10-CM | POA: Diagnosis present

## 2023-07-18 DIAGNOSIS — K449 Diaphragmatic hernia without obstruction or gangrene: Secondary | ICD-10-CM

## 2023-07-18 DIAGNOSIS — R1310 Dysphagia, unspecified: Secondary | ICD-10-CM

## 2023-07-18 MED ORDER — SODIUM CHLORIDE 0.9 % IV SOLN: 500.0000 mL | Freq: Once | INTRAVENOUS | Status: DC

## 2023-07-18 NOTE — Patient Instructions (Signed)
 Please read handouts provided. Await pathology results. Soft diet next few days. Consider increasing Protonix  dose to 40 mg daily, 20 mg twice daily.   YOU HAD AN ENDOSCOPIC PROCEDURE TODAY AT [facility]:   Refer to the procedure report that was given to you for any specific questions about what was found during the examination.  If the procedure report does not answer your questions, please call your gastroenterologist to clarify.  If you requested that your care partner not be given the details of your procedure findings, then the procedure report has been included in a sealed envelope for you to review at your convenience later.  YOU SHOULD EXPECT: Some feelings of bloating in the abdomen. Passage of more gas than usual.  Walking can help get rid of the air that was put into your GI tract during the procedure and reduce the bloating. If you had a lower endoscopy (such as a colonoscopy or flexible sigmoidoscopy) you may notice spotting of blood in your stool or on the toilet paper. If you underwent a bowel prep for your procedure, you may not have a normal bowel movement for a few days.  Please Note:  You might notice some irritation and congestion in your nose or some drainage.  This is from the oxygen used during your procedure.  There is no need for concern and it should clear up in a day or so.  SYMPTOMS TO REPORT IMMEDIATELY:   Following upper endoscopy (EGD)  Vomiting of blood or coffee ground material  New chest pain or pain under the shoulder blades  Painful or persistently difficult swallowing  New shortness of breath  Fever of 100F or higher  Black, tarry-looking stools  For urgent or emergent issues, a gastroenterologist can be reached at any hour by calling [phone]. Do not use MyChart messaging for urgent concerns.    DIET:  We do recommend a small meal at first, but then you may proceed to your regular diet.  Drink plenty of fluids but you should  avoid alcoholic beverages for 24 hours.  ACTIVITY:  You should plan to take it easy for the rest of today and you should NOT DRIVE or use heavy machinery until tomorrow (because of the sedation medicines used during the test).    FOLLOW UP: Our staff will call the number listed on your records the next business day following your procedure.  We will call around 7:15- 8:00 am to check on you and address any questions or concerns that you may have regarding the information given to you following your procedure. If we do not reach you, we will leave a message.     If any biopsies were taken you will be contacted by phone or by letter within the next 1-3 weeks.  Please call us  at [phone] if you have not heard about the biopsies in 3 weeks.    SIGNATURES/CONFIDENTIALITY: You and/or your care partner have signed paperwork which will be entered into your electronic medical record.  These signatures attest to the fact that that the information above on your After Visit Summary has been reviewed and is understood.  Full responsibility of the confidentiality of this discharge information lies with you and/or your care-partner.

## 2023-07-18 NOTE — Progress Notes (Signed)
 Pt's states no medical or surgical changes since previsit or office visit.

## 2023-07-18 NOTE — Progress Notes (Signed)
 Called to room to assist during endoscopic procedure.  Patient ID and intended procedure confirmed with present staff. Received instructions for my participation in the procedure from the performing physician.

## 2023-07-18 NOTE — Progress Notes (Signed)
 Vss nad trans to pacu

## 2023-07-18 NOTE — Op Note (Signed)
 [facility] Patient Name: Micheal Brewer Procedure Date: 07/18/2023 9:27 AM MRN: [id number] Endoscopist: Inocente Hausen , MD, [id number] Age: 48 Referring MD:  Date of Birth: April 09, 1975 Gender: Male Account #: [id number] Procedure:                Upper GI endoscopy Indications:              Dysphagia, Follow-up of eosinophilic esophagitis,                            History of esophageal stenosis requiring dilation Medicines:                Monitored Anesthesia Care Procedure:                Pre-Anesthesia Assessment:                           - Prior to the procedure, a History and Physical                            was performed, and patient medications and                            allergies were reviewed. The patient's tolerance of                            previous anesthesia was also reviewed. The risks                            and benefits of the procedure and the sedation                            options and risks were discussed with the patient.                            All questions were answered, and informed consent                            was obtained. Prior Anticoagulants: The patient has                            taken no anticoagulant or antiplatelet agents. ASA                            Grade Assessment: II - A patient with mild systemic                            disease. After reviewing the risks and benefits,                            the patient was deemed in satisfactory condition to                            undergo the procedure.  After obtaining informed consent, the endoscope was                            passed under direct vision. Throughout the                            procedure, the patient's blood pressure, pulse, and                            oxygen saturations were monitored continuously. The                            Olympus Scope [id number] was introduced through the                             mouth, and advanced to the second part of duodenum.                            The upper GI endoscopy was accomplished without                            difficulty. The patient tolerated the procedure                            well. Scope In: Scope Out: Findings:                 Mucosal changes including longitudinal furrows were                            found in the middle third of the esophagus and in                            the lower third of the esophagus. Esophageal                            findings were graded using the Eosinophilic                            Esophagitis Endoscopic Reference Score (EoE-EREFS)                            as: Edema Grade 1 Present (decreased clarity or                            absence of vascular markings), Rings Grade 0 None                            (no ridges or rings seen), Exudates Grade 0 None                            (no white lesions seen), Furrows Grade 1 Mild                            (  vertical lines without visible depth) and                            Stricture none (no stricture found). Biopsies were                            obtained from the proximal and distal esophagus                            with cold forceps for histology of suspected                            eosinophilic esophagitis.                           A guidewire was placed and the scope was withdrawn.                            Dilation was performed in the entire esophagus with                            a Savary dilator with no resistance at 16 mm and 18                            mm. Post dilation inspection did not show any                            evidence of heme or mucosal rent.                           Multiple 3 to 6 mm sessile polyps were found in the                            gastric fundus and in the gastric body.                           The gastric body, gastric antrum, cardia (on                            retroflexion) and gastric  fundus (on retroflexion)                            were normal.                           A small hiatal hernia was present.                           The duodenal bulb and second portion of the                            duodenum were normal. Complications:            No immediate complications. Estimated blood loss:  Minimal. Estimated Blood Loss:     Estimated blood loss was minimal. Impression:               - Esophageal mucosal changes secondary to                            eosinophilic esophagitis.                           - Multiple gastric polyps.                           - Normal gastric body, antrum, cardia and gastric                            fundus.                           - Small hiatal hernia.                           - Normal duodenal bulb and second portion of the                            duodenum.                           - Biopsies were taken with a cold forceps for                            evaluation of eosinophilic esophagitis.                           - Dilation performed in the entire esophagus. Recommendation:           - Discharge patient to home (ambulatory).                           - Await pathology results.                           - Consider increasing Protonix  dose to 40 mg orally                            daily, 20 mg p.o. twice daily given endoscopic                            evidence of active EOE.                           - The findings and recommendations were discussed                            with the patient's family.                           - Patient has a contact number available for  emergencies. The signs and symptoms of potential                            delayed complications were discussed with the                            patient. Return to normal activities tomorrow.                            Written discharge instructions were provided to the                             patient. Inocente Hausen, MD 07/18/2023 10:10:45 AM This report has been signed electronically.

## 2023-07-18 NOTE — Progress Notes (Signed)
 Agree with the assessment and plan as outlined by Quentin Mulling, PA-C. ? ?Keron Neenan, DO, FACG ? ?

## 2023-07-19 ENCOUNTER — Telehealth: Payer: Self-pay

## 2023-07-19 NOTE — Telephone Encounter (Signed)
  Follow up Call-     07/18/2023    8:42 AM 02/03/2021    8:54 AM  Call back number  Post procedure Call Back phone  # [phone]  Permission to leave phone message Yes Yes    Follow up call, LVM.

## 2023-07-20 LAB — SURGICAL PATHOLOGY

## 2023-07-22 ENCOUNTER — Ambulatory Visit: Payer: Self-pay | Admitting: Pediatrics

## 2023-08-04 ENCOUNTER — Other Ambulatory Visit: Payer: Self-pay

## 2023-08-04 MED ORDER — PANTOPRAZOLE SODIUM 20 MG PO TBEC: 20.0000 mg | DELAYED_RELEASE_TABLET | Freq: Two times a day (BID) | ORAL | 3 refills | Status: AC

## 2023-08-04 NOTE — Progress Notes (Signed)
 Post procedure, Dr. Suzann recommended patient start Protonix  40 mg daily, taken as 20mg  twice a day.

## 2023-09-19 ENCOUNTER — Encounter: Payer: Self-pay | Admitting: Sports Medicine

## 2023-10-06 ENCOUNTER — Other Ambulatory Visit: Payer: Self-pay | Admitting: Family Medicine

## 2023-10-06 DIAGNOSIS — I10 Essential (primary) hypertension: Secondary | ICD-10-CM

## 2023-10-18 ENCOUNTER — Ambulatory Visit (INDEPENDENT_AMBULATORY_CARE_PROVIDER_SITE_OTHER): Admitting: Family Medicine

## 2023-10-18 ENCOUNTER — Encounter: Payer: Self-pay | Admitting: Family Medicine

## 2023-10-18 VITALS — BP 104/59 | HR 59 | Ht 72.0 in | Wt 186.1 lb

## 2023-10-18 DIAGNOSIS — Z0000 Encounter for general adult medical examination without abnormal findings: Secondary | ICD-10-CM

## 2023-10-18 DIAGNOSIS — Z23 Encounter for immunization: Secondary | ICD-10-CM

## 2023-10-18 DIAGNOSIS — I10 Essential (primary) hypertension: Secondary | ICD-10-CM | POA: Diagnosis not present

## 2023-10-18 MED ORDER — LISINOPRIL 10 MG PO TABS: 10.0000 mg | ORAL_TABLET | Freq: Every day | ORAL | 1 refills | Status: AC

## 2023-10-18 NOTE — Progress Notes (Signed)
 Complete physical exam  Patient: Micheal Brewer   DOB: 1975/04/18   48 y.o. Male  MRN: [id number]  Subjective:    Chief Complaint  Patient presents with   Annual Exam    [name] is a 48 y.o. male who presents today for a complete physical exam. He reports consuming a general diet. Exercising some  He generally feels well. He reports sleeping fair. He does not have additional problems to discuss today.    Most recent fall risk assessment:    09/16/2021   11:12 AM  Fall Risk   Falls in the past year? 0  Number falls in past yr: 0  Injury with Fall? 0  Risk for fall due to : No Fall Risks  Follow up Falls evaluation completed      Data saved with a previous flowsheet row definition     Most recent depression screenings:    10/18/2023    8:13 AM 10/18/2023    8:12 AM  PHQ 2/9 Scores  PHQ - 2 Score 0 0  PHQ- 9 Score 2          Patient Care Team: [name] Dorothyann BIRCH, MD as PCP - General   Outpatient Medications Prior to Visit  Medication Sig   doxycycline  (MONODOX ) 50 MG capsule Take 50 mg by mouth daily.   pantoprazole  (PROTONIX ) 20 MG tablet Take 1 tablet (20 mg total) by mouth 2 (two) times daily. Please call [phone] to schedule an office visit for more refills   vitamin B-12 (CYANOCOBALAMIN) 1000 MCG tablet Take 1 tablet (1,000 mcg total) by mouth daily.   [DISCONTINUED] lisinopril  (ZESTRIL ) 10 MG tablet TAKE 1 TABLET BY MOUTH EVERY DAY   [DISCONTINUED] 0.9 %  sodium chloride  infusion    No facility-administered medications prior to visit.    ROS        Objective:     BP (!) 104/59   Pulse (!) 59   Ht 6' (1.829 m)   Wt 186 lb 1.9 oz (84.4 kg)   SpO2 99%   BMI 25.24 kg/m     Physical Exam Constitutional:      Appearance: Normal appearance.  HENT:     Head: Normocephalic and atraumatic.     Right Ear: Tympanic membrane, ear canal and external ear normal.     Left Ear: Tympanic membrane, ear canal and external ear normal.      Nose: Nose normal.     Mouth/Throat:     Pharynx: Oropharynx is clear.  Eyes:     Extraocular Movements: Extraocular movements intact.     Conjunctiva/sclera: Conjunctivae normal.     Pupils: Pupils are equal, round, and reactive to light.  Neck:     Thyroid: No thyromegaly.  Cardiovascular:     Rate and Rhythm: Normal rate and regular rhythm.  Pulmonary:     Effort: Pulmonary effort is normal.     Breath sounds: Normal breath sounds.  Abdominal:     General: Bowel sounds are normal.     Palpations: Abdomen is soft.     Tenderness: There is no abdominal tenderness.  Musculoskeletal:        General: No swelling.     Cervical back: Neck supple.  Skin:    General: Skin is warm and dry.  Neurological:     Mental Status: He is oriented to person, place, and time.  Psychiatric:        Mood and Affect: Mood normal.  Behavior: Behavior normal.      No results found for any visits on 10/18/23.      Assessment & Plan:    Routine Health Maintenance and Physical Exam  Immunization History  Administered Date(s) Administered   Influenza Split 10/03/2011   Influenza, Seasonal, Injecte, Preservative Fre 10/10/2022, 10/18/2023   Influenza,inj,Quad PF,6+ Mos 10/07/2013, 11/27/2017, 11/28/2018, 12/19/2019, 09/16/2021   Moderna Sars-Covid-2 Vaccination 04/04/2019, 05/02/2019   Tdap 11/27/2017    Health Maintenance  Topic Date Due   HIV Screening  Never done   Hepatitis B Vaccines 19-59 Average Risk (1 of 3 - 19+ 3-dose series) Never done   COVID-19 Vaccine (3 - 2025-26 season) 09/18/2023   Colonoscopy  02/03/2026   DTaP/Tdap/Td (2 - Td or Tdap) 11/28/2027   Influenza Vaccine  Completed   Hepatitis C Screening  Completed   Pneumococcal Vaccine  Aged Out   HPV VACCINES  Aged Out   Meningococcal B Vaccine  Aged Out    Discussed health benefits of physical activity, and encouraged him to engage in regular exercise appropriate for his age and condition.  Problem List Items  Addressed This Visit       Cardiovascular and Mediastinum   Primary hypertension   Relevant Medications   lisinopril  (ZESTRIL ) 10 MG tablet   Other Visit Diagnoses       Wellness examination    -  Primary   Relevant Orders   CMP14+EGFR   Lipid panel   CBC     Encounter for immunization       Relevant Orders   Flu vaccine trivalent PF, 6mos and older(Flulaval,Afluria,Fluarix,Fluzone) (Completed)       BP on lower side today. He feels it is from fasting.  If staying low at home may need to reduce lisinopril  if needed.  He says normal SBP 120-130s.   Return in about 6 months (around 04/17/2024) for Hypertension.     Dorothyann Byars, MD

## 2023-10-19 ENCOUNTER — Ambulatory Visit: Payer: Self-pay | Admitting: Family Medicine

## 2023-10-19 LAB — CMP14+EGFR
ALT: 14 IU/L (ref 0–44)
AST: 22 IU/L (ref 0–40)
Albumin: 4.5 g/dL (ref 4.1–5.1)
Alkaline Phosphatase: 69 IU/L (ref 47–123)
BUN/Creatinine Ratio: 10 (ref 9–20)
BUN: 11 mg/dL (ref 6–24)
Bilirubin Total: 0.4 mg/dL (ref 0.0–1.2)
CO2: 19 mmol/L — ABNORMAL LOW (ref 20–29)
Calcium: 10 mg/dL (ref 8.7–10.2)
Chloride: 106 mmol/L (ref 96–106)
Creatinine, Ser: 1.11 mg/dL (ref 0.76–1.27)
Globulin, Total: 2.4 g/dL (ref 1.5–4.5)
Glucose: 90 mg/dL (ref 70–99)
Potassium: 4.3 mmol/L (ref 3.5–5.2)
Sodium: 141 mmol/L (ref 134–144)
Total Protein: 6.9 g/dL (ref 6.0–8.5)
eGFR: 82 mL/min/1.73 (ref >59)

## 2023-10-19 LAB — CBC
Hematocrit: 42.4 % (ref 37.5–51.0)
Hemoglobin: 14.1 g/dL (ref 13.0–17.7)
MCH: 32.6 pg (ref 26.6–33.0)
MCHC: 33.3 g/dL (ref 31.5–35.7)
MCV: 98 fL — ABNORMAL HIGH (ref 79–97)
Platelets: 256 x10E3/uL (ref 150–450)
RBC: 4.32 x10E6/uL (ref 4.14–5.80)
RDW: 12.1 % (ref 11.6–15.4)
WBC: 5.1 x10E3/uL (ref 3.4–10.8)

## 2023-10-19 LAB — LIPID PANEL
Chol/HDL Ratio: 4 ratio (ref 0.0–5.0)
Cholesterol, Total: 180 mg/dL (ref 100–199)
HDL: 45 mg/dL (ref >39)
LDL Chol Calc (NIH): 123 mg/dL — ABNORMAL HIGH (ref 0–99)
Triglycerides: 64 mg/dL (ref 0–149)
VLDL Cholesterol Cal: 12 mg/dL (ref 5–40)

## 2023-10-19 NOTE — Progress Notes (Signed)
 Hi Micheal Brewer, metabolic panel overall looks good.  LDL cholesterol slightly elevated summer last year continue to work on healthy diet and regular exercise.  Blood count is normal no anemia.  The MCV is a little on the larger side so we are gena call the lab and just make sure that they can check a B12 and a folate level to make sure that those are adequate
